# Patient Record
Sex: Male | Born: 1960 | Race: White | Hispanic: No | Marital: Married | Smoking: Never smoker
Health system: Southern US, Community
[De-identification: ages and names within clinical notes are randomized; demographics above are authoritative.]

## PROBLEM LIST (undated history)

## (undated) DIAGNOSIS — F32A Depression, unspecified: Secondary | ICD-10-CM

## (undated) DIAGNOSIS — K219 Gastro-esophageal reflux disease without esophagitis: Secondary | ICD-10-CM

## (undated) DIAGNOSIS — F419 Anxiety disorder, unspecified: Secondary | ICD-10-CM

## (undated) DIAGNOSIS — F329 Major depressive disorder, single episode, unspecified: Secondary | ICD-10-CM

## (undated) HISTORY — PX: SHOULDER OPEN ROTATOR CUFF REPAIR: SHX2407

---

## 2004-03-16 ENCOUNTER — Observation Stay (HOSPITAL_COMMUNITY): Admission: EM | Admit: 2004-03-16 | Discharge: 2004-03-17 | Payer: Self-pay | Admitting: Emergency Medicine

## 2009-01-06 ENCOUNTER — Emergency Department (HOSPITAL_BASED_OUTPATIENT_CLINIC_OR_DEPARTMENT_OTHER): Admission: EM | Admit: 2009-01-06 | Discharge: 2009-01-06 | Payer: Self-pay | Admitting: Emergency Medicine

## 2009-01-17 ENCOUNTER — Encounter: Admission: RE | Admit: 2009-01-17 | Discharge: 2009-02-21 | Payer: Self-pay | Admitting: Orthopedic Surgery

## 2010-08-23 LAB — URINALYSIS, ROUTINE W REFLEX MICROSCOPIC
Bilirubin Urine: NEGATIVE
Glucose, UA: NEGATIVE mg/dL
Hgb urine dipstick: NEGATIVE
Ketones, ur: NEGATIVE mg/dL
Nitrite: NEGATIVE
Protein, ur: NEGATIVE mg/dL
Specific Gravity, Urine: 1.028 (ref 1.005–1.030)
Urobilinogen, UA: 1 mg/dL (ref 0.0–1.0)
pH: 6 (ref 5.0–8.0)

## 2010-08-23 LAB — URINE MICROSCOPIC-ADD ON

## 2010-10-03 NOTE — H&P (Signed)
NAME:  Russell Cross, Russell Cross NO.:  [id number]   MEDICAL RECORD NO.:  [id number]          PATIENT TYPE:  EMS   LOCATION:  [facility]                         FACILITY:  [facility]   PHYSICIAN:  Creta Cross, M.D. LHCDATE OF BIRTH:  April 27, 1961   DATE OF ADMISSION:  03/16/2004  DATE OF DISCHARGE:                                HISTORY & PHYSICAL   PRIMARY CARE PHYSICIAN:  Keturah Cross, M.D.   CHIEF COMPLAINT:  Chest pain.   HISTORY:  Russell Cross is a 50 year old male with no significant medical  history who was in his usual state of health until this morning when, at  work, he noticed some mild chest tightness that was unrelated to exertion or  position.  At approximately noon, he had a moderate to severe episode of  left-sided chest tightness that was associated with palpitations, dizziness,  presyncope, and impending doom.  These symptoms resolved spontaneously, but  did recur for one episode that was of lesser intensity and shorter duration.  Again, no relation to exertion was noted.  He denies any exertional symptoms  whatsoever.  He has had no orthopnea, PND, or edema.  He has no other  complaints on complete review of systems.   PAST MEDICAL HISTORY:  1.  Positive for renal stones.  2.  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   ALLERGIES:  No known drug allergies.   MEDICATIONS:  1.  Ibuprofen p.r.n.  2.  Triaflex, which I believe is a glucosamine and chondroitin product.   SOCIAL HISTORY:  [place] and 3  children.  He works as a Chartered certified accountant at [employer].  He does not smoke.  He drinks  less than 1 drink per day.  He denies herbal medications, other than the  Triaflex.  He denies illicit drugs.  He does lift weights 3 times a week.   FAMILY HISTORY:  His mother is alive at age 32 and healthy.  Father is alive  at age 20 and has some psychiatric disorder that is described as anxiety or  phobia.   REVIEW OF SYSTEMS:  Positive only for that mentioned in the HPI.  He does  admit to some anxiety related to stress at work.  Otherwise, the 10-point  review is completely negative.   PHYSICAL EXAMINATION:  VITAL SIGNS:  He is afebrile.  Blood pressure is  122/51, heart rate 81.  He is saturating 99% on 2 liters.  GENERAL:  He is a well-developed male in no acute distress.  HEENT:  Unremarkable.  Good dentition.  NECK:  Supple.  No lymphadenopathy.  No thyromegaly.  No JVD.  No bruits.  Normal carotid upstroke.  CARDIOVASCULAR:  Regular rate and rhythm with a normal S1, normal S2.  No  murmur, rub, or gallop.  LUNGS:  Clear.  CHEST:  The chest wall is nontender.  ABDOMEN:  Soft, nondistended, nontender, without rebound or guarding.  RECTAL:  Deferred.  EXTREMITIES:  No cyanosis, clubbing, or edema.  MUSCULOSKELETAL:  Again, he had no chest wall tenderness.  NEUROLOGIC:  Completely nonfocal.  Chest x-ray is pending.  EKG revealed normal sinus rhythm at a rate of 69.  He does have a rightward axis and some nonspecific ST-T wave changes, but no  evidence of injury or ischemia.   LABORATORY DATA:  The hematocrit is 41, sodium 140, potassium 3.5, chloride  105, bicarbonate 26, BUN 16, creatinine 1.1, glucose 108.  He had a troponin  of less than 0.05, and an MB of 1.1.  The remainder of his labs are pending.   ASSESSMENT AND PLAN:  Atypical chest pain.  This patient is very unlikely to  have an acute coronary syndrome; however, it is reasonable to bring him in  for observation and rule him out for myocardial infarction.  Given the low  suspicion, I think that if he rules out, he can be released with follow up  and possibly a functional study.  While he is in house, I will check his  lipids and his TSH.  We will communicate all findings with Russell Cross to  complete the work-up, which might need to focus on non-cardiac chest pain  and possible anxiety.       RPK/MEDQ  D:   03/16/2004  T:  03/16/2004  Job:  [id number]   cc:   Keturah Cross, M.D.  [place](?)

## 2010-12-27 ENCOUNTER — Other Ambulatory Visit: Payer: Self-pay

## 2010-12-27 ENCOUNTER — Emergency Department (HOSPITAL_BASED_OUTPATIENT_CLINIC_OR_DEPARTMENT_OTHER)
Admission: EM | Admit: 2010-12-27 | Discharge: 2010-12-27 | Disposition: A | Discharge status: Home / Self Care | Payer: BC Managed Care – PPO | Attending: Emergency Medicine | Admitting: Emergency Medicine

## 2010-12-27 ENCOUNTER — Emergency Department (INDEPENDENT_AMBULATORY_CARE_PROVIDER_SITE_OTHER): Payer: BC Managed Care – PPO

## 2010-12-27 ENCOUNTER — Encounter: Payer: Self-pay | Admitting: Emergency Medicine

## 2010-12-27 DIAGNOSIS — R1031 Right lower quadrant pain: Secondary | ICD-10-CM

## 2010-12-27 DIAGNOSIS — F3289 Other specified depressive episodes: Secondary | ICD-10-CM | POA: Insufficient documentation

## 2010-12-27 DIAGNOSIS — F329 Major depressive disorder, single episode, unspecified: Secondary | ICD-10-CM | POA: Insufficient documentation

## 2010-12-27 DIAGNOSIS — R1084 Generalized abdominal pain: Secondary | ICD-10-CM

## 2010-12-27 DIAGNOSIS — R12 Heartburn: Secondary | ICD-10-CM | POA: Insufficient documentation

## 2010-12-27 DIAGNOSIS — R110 Nausea: Secondary | ICD-10-CM

## 2010-12-27 HISTORY — DX: Depression, unspecified: F32.A

## 2010-12-27 HISTORY — DX: Major depressive disorder, single episode, unspecified: F32.9

## 2010-12-27 LAB — URINALYSIS, ROUTINE W REFLEX MICROSCOPIC
Bilirubin Urine: NEGATIVE
Glucose, UA: NEGATIVE mg/dL
Hgb urine dipstick: NEGATIVE
Ketones, ur: NEGATIVE mg/dL
Leukocytes, UA: NEGATIVE
Nitrite: NEGATIVE
Protein, ur: NEGATIVE mg/dL
Specific Gravity, Urine: 1.026 (ref 1.005–1.030)
Urobilinogen, UA: 0.2 mg/dL (ref 0.0–1.0)
pH: 5.5 (ref 5.0–8.0)

## 2010-12-27 LAB — CBC
HCT: 43.4 % (ref 39.0–52.0)
Hemoglobin: 15.5 g/dL (ref 13.0–17.0)
MCH: 32.8 pg (ref 26.0–34.0)
MCHC: 35.7 g/dL (ref 30.0–36.0)
MCV: 91.8 fL (ref 78.0–100.0)
Platelets: 251 10*3/uL (ref 150–400)
RBC: 4.73 MIL/uL (ref 4.22–5.81)
RDW: 12.2 % (ref 11.5–15.5)
WBC: 5.5 10*3/uL (ref 4.0–10.5)

## 2010-12-27 LAB — COMPREHENSIVE METABOLIC PANEL
ALT: 36 U/L (ref 0–53)
AST: 31 U/L (ref 0–37)
Albumin: 3.8 g/dL (ref 3.5–5.2)
Alkaline Phosphatase: 80 U/L (ref 39–117)
BUN: 16 mg/dL (ref 6–23)
CO2: 25 mEq/L (ref 19–32)
Calcium: 9.6 mg/dL (ref 8.4–10.5)
Chloride: 102 mEq/L (ref 96–112)
Creatinine, Ser: 1.4 mg/dL — ABNORMAL HIGH (ref 0.50–1.35)
GFR calc Af Amer: >60 mL/min (ref >60)
GFR calc non Af Amer: 54 mL/min — ABNORMAL LOW (ref >60)
Glucose, Bld: 101 mg/dL — ABNORMAL HIGH (ref 70–99)
Potassium: 3.7 mEq/L (ref 3.5–5.1)
Sodium: 137 mEq/L (ref 135–145)
Total Bilirubin: 0.3 mg/dL (ref 0.3–1.2)
Total Protein: 7.2 g/dL (ref 6.0–8.3)

## 2010-12-27 LAB — LIPASE, BLOOD: Lipase: 36 U/L (ref 11–59)

## 2010-12-27 MED ORDER — IOHEXOL 300 MG/ML  SOLN
100.0000 mL | Freq: Once | INTRAMUSCULAR | Status: AC | PRN
  Administered 2010-12-27: 100 mL via INTRAVENOUS

## 2010-12-27 MED ORDER — HYDROMORPHONE HCL 1 MG/ML IJ SOLN
1.0000 mg | Freq: Once | INTRAMUSCULAR | Status: AC
  Administered 2010-12-27: 1 mg via INTRAVENOUS
  Filled 2010-12-27: qty 1

## 2010-12-27 MED ORDER — ONDANSETRON HCL 4 MG/2ML IJ SOLN
4.0000 mg | Freq: Once | INTRAMUSCULAR | Status: AC
Start: 2010-12-27 — End: 2010-12-27
  Administered 2010-12-27: 4 mg via INTRAVENOUS
  Filled 2010-12-27: qty 2

## 2010-12-27 MED ORDER — PANTOPRAZOLE SODIUM 20 MG PO TBEC: 20.0000 mg | DELAYED_RELEASE_TABLET | Freq: Every day | ORAL | Status: AC

## 2010-12-27 MED ORDER — SODIUM CHLORIDE 0.9 % IV BOLUS (SEPSIS)
1000.0000 mL | Freq: Once | INTRAVENOUS | Status: AC
  Administered 2010-12-27: 1000 mL via INTRAVENOUS

## 2010-12-27 NOTE — ED Notes (Signed)
Patient states that he felt light headed this morning and lost consciousness after experiencing some abdominal pain in the mid epigastric area. Pt. States that he has traveled recently and after returning from his trip from the beach yesterday experienced some back pain. Pt. Is experiencing pain in his epigastric region, left side and back this morning with back pain and dizziness. Patient states that he had diarrhea this morning x 2. Hypoactive bowel sound present.

## 2010-12-27 NOTE — ED Notes (Signed)
Pt brought to room,  EKG performed.  Pt placed on cardiac monitor, pulse oximetry, and oxygen at 2L per [place].  IV established, blood drawn for labs  

## 2010-12-27 NOTE — ED Provider Notes (Signed)
History     CSN: [id number] Arrival date & time: 12/27/2010 12:21 PM  Chief Complaint  Patient presents with  . Abdominal Pain   Patient is a 50 y.o. male presenting with abdominal pain.  Abdominal Pain The primary symptoms of the illness include abdominal pain. The current episode started 13 to 24 hours ago. The onset of the illness was gradual. The problem has been gradually worsening.  Associated with: nothing. The patient has not had a change in bowel habit. Additional symptoms associated with the illness include heartburn. Symptoms associated with the illness do not include chills, diaphoresis, constipation, urgency, frequency or back pain. Significant associated medical issues include GERD. Significant associated medical issues do not include inflammatory bowel disease, diabetes, gallstones, diverticulitis or cardiac disease.   patient reports he has a hernia at umbilicus. Patient complains of pain in the suprapubic area. He also reports that he has had some indigestion and belching  Past Medical History  Diagnosis Date  . Depression     Past Surgical History  Procedure Date  . Shoulder open rotator cuff repair     History reviewed. No pertinent family history.  History  Substance Use Topics  . Smoking status: Never Smoker   . Smokeless tobacco: Not on file  . Alcohol Use: Yes     occasionally      Review of Systems  Constitutional: Negative for chills and diaphoresis.  Gastrointestinal: Positive for heartburn and abdominal pain. Negative for constipation.  Genitourinary: Negative for urgency and frequency.  Musculoskeletal: Negative for back pain.  All other systems reviewed and are negative.    Physical Exam  BP 152/91  Pulse 75  Temp(Src) 97.9 F (36.6 C) (Oral)  Resp 18  Ht 5\' 9"  (1.753 m)  Wt 202 lb (91.627 kg)  BMI 29.83 kg/m2  SpO2 99%  Physical Exam  Constitutional: He is oriented to person, place, and time. He appears well-developed and  well-nourished.  HENT:  Head: Normocephalic and atraumatic.  Eyes: Conjunctivae and EOM are normal. Pupils are equal, round, and reactive to light.  Neck: Normal range of motion. Neck supple.  Cardiovascular: Normal rate and regular rhythm.   Pulmonary/Chest: Effort normal and breath sounds normal.  Abdominal: Soft. There is tenderness.  Musculoskeletal: Normal range of motion.  Neurological: He is alert and oriented to person, place, and time. He has normal reflexes.  Skin: Skin is warm and dry.  Psychiatric: He has a normal mood and affect.    ED Course  Procedures  MDM CT is normal. Creatinine is slightly elevated at 1.40 patient is given IV normal saline x1 L. Other laboratory evaluations are normal I advised patient he should see his physician for recheck on Monday. He is to return to the emergency department if symptoms worsen or change  Results for orders placed during the hospital encounter of 12/27/10  URINALYSIS, ROUTINE W REFLEX MICROSCOPIC      Component Value Range   Color, Urine YELLOW  YELLOW    Appearance CLEAR  CLEAR    Specific Gravity, Urine 1.026  1.005 - 1.030    pH 5.5  5.0 - 8.0    Glucose, UA NEGATIVE  NEGATIVE (mg/dL)   Hgb urine dipstick NEGATIVE  NEGATIVE    Bilirubin Urine NEGATIVE  NEGATIVE    Ketones, ur NEGATIVE  NEGATIVE (mg/dL)   Protein, ur NEGATIVE  NEGATIVE (mg/dL)   Urobilinogen, UA 0.2  0.0 - 1.0 (mg/dL)   Nitrite NEGATIVE  NEGATIVE    Leukocytes,  UA NEGATIVE  NEGATIVE   CBC      Component Value Range   WBC 5.5  4.0 - 10.5 (K/uL)   RBC 4.73  4.22 - 5.81 (MIL/uL)   Hemoglobin 15.5  13.0 - 17.0 (g/dL)   HCT 95.6  21.3 - 08.6 (%)   MCV 91.8  78.0 - 100.0 (fL)   MCH 32.8  26.0 - 34.0 (pg)   MCHC 35.7  30.0 - 36.0 (g/dL)   RDW 57.8  46.9 - 62.9 (%)   Platelets 251  150 - 400 (K/uL)  COMPREHENSIVE METABOLIC PANEL      Component Value Range   Sodium 137  135 - 145 (mEq/L)   Potassium 3.7  3.5 - 5.1 (mEq/L)   Chloride 102  96 - 112  (mEq/L)   CO2 25  19 - 32 (mEq/L)   Glucose, Bld 101 (*) 70 - 99 (mg/dL)   BUN 16  6 - 23 (mg/dL)   Creatinine, Ser 5.28 (*) 0.50 - 1.35 (mg/dL)   Calcium 9.6  8.4 - 41.3 (mg/dL)   Total Protein 7.2  6.0 - 8.3 (g/dL)   Albumin 3.8  3.5 - 5.2 (g/dL)   AST 31  0 - 37 (U/L)   ALT 36  0 - 53 (U/L)   Alkaline Phosphatase 80  39 - 117 (U/L)   Total Bilirubin 0.3  0.3 - 1.2 (mg/dL)   GFR calc non Af Amer 54 (*) >60 (mL/min)   GFR calc Af Amer >60  >60 (mL/min)  LIPASE, BLOOD      Component Value Range   Lipase 36  11 - 59 (U/L)   Ct Abdomen Pelvis W Contrast  12/27/2010  *RADIOLOGY REPORT*  Clinical Data: RLQ abdominal pain, nausea  CT ABDOMEN AND PELVIS WITH CONTRAST  Technique:  Multidetector CT imaging of the abdomen and pelvis was performed following the standard protocol during bolus administration of intravenous contrast.  Contrast: 100 ml Omnipaque-300 IV  Comparison: None.  Findings: Minimal dependent atelectasis in the lung bases.  Liver is notable for a 7 mm hypoenhancing lesion in the posterior several right hepatic lobe (series 2/image 23), incompletely characterized, likely benign.  The spleen, pancreas, adrenal glands are within normal limits.  Gallbladder is unremarkable.  No intrahepatic or extrahepatic ductal dilatation.  Left kidney is notable for a renal cyst.  Right kidney is within normal limits.  No hydronephrosis.  No evidence of bowel obstruction.  Normal appendix.  No evidence of abdominal aortic aneurysm.  No abdominopelvic ascites.  No suspicious abdominopelvic lymphadenopathy.  Prostate is unremarkable.  Bladder is within normal limits.  Mild degenerative changes of the visualized thoracolumbar spine.  IMPRESSION: Normal appendix.  No evidence of bowel obstruction.  No CT findings to account for the patient's abdominal symptoms.  Original Report Authenticated By: Charline Bills, M.D.   Date: 12/27/2010  Rate: 69  Rhythm: normal sinus rhythm  QRS Axis: normal  Intervals:  normal  ST/T Wave abnormalities: normal  Conduction Disutrbances:none  Narrative Interpretation:   Old EKG Reviewed: unchanged     Langston Masker, [place] 12/27/10 1603

## 2010-12-28 ENCOUNTER — Encounter (HOSPITAL_BASED_OUTPATIENT_CLINIC_OR_DEPARTMENT_OTHER): Payer: Self-pay | Admitting: Emergency Medicine

## 2010-12-29 NOTE — ED Provider Notes (Signed)
Evaluation and management procedures were performed by the PA/NP under my supervision/collaboration.    Felisa Bonier, MD 12/29/10 [phone]

## 2012-07-03 ENCOUNTER — Emergency Department (HOSPITAL_COMMUNITY)
Admission: EM | Admit: 2012-07-03 | Discharge: 2012-07-03 | Disposition: A | Discharge status: Home / Self Care | Payer: BC Managed Care – PPO | Attending: Emergency Medicine | Admitting: Emergency Medicine

## 2012-07-03 ENCOUNTER — Encounter (HOSPITAL_COMMUNITY): Payer: Self-pay | Admitting: *Deleted

## 2012-07-03 DIAGNOSIS — R42 Dizziness and giddiness: Secondary | ICD-10-CM | POA: Insufficient documentation

## 2012-07-03 DIAGNOSIS — R079 Chest pain, unspecified: Secondary | ICD-10-CM

## 2012-07-03 DIAGNOSIS — Z79899 Other long term (current) drug therapy: Secondary | ICD-10-CM | POA: Insufficient documentation

## 2012-07-03 DIAGNOSIS — F3289 Other specified depressive episodes: Secondary | ICD-10-CM | POA: Insufficient documentation

## 2012-07-03 DIAGNOSIS — K219 Gastro-esophageal reflux disease without esophagitis: Secondary | ICD-10-CM

## 2012-07-03 DIAGNOSIS — F329 Major depressive disorder, single episode, unspecified: Secondary | ICD-10-CM | POA: Insufficient documentation

## 2012-07-03 DIAGNOSIS — F411 Generalized anxiety disorder: Secondary | ICD-10-CM | POA: Insufficient documentation

## 2012-07-03 DIAGNOSIS — R0602 Shortness of breath: Secondary | ICD-10-CM | POA: Insufficient documentation

## 2012-07-03 HISTORY — DX: Gastro-esophageal reflux disease without esophagitis: K21.9

## 2012-07-03 HISTORY — DX: Anxiety disorder, unspecified: F41.9

## 2012-07-03 LAB — CBC WITH DIFFERENTIAL/PLATELET
Basophils Absolute: 0.1 10*3/uL (ref 0.0–0.1)
Basophils Relative: 2 % — ABNORMAL HIGH (ref 0–1)
Eosinophils Absolute: 0.2 10*3/uL (ref 0.0–0.7)
Eosinophils Relative: 3 % (ref 0–5)
HCT: 44.4 % (ref 39.0–52.0)
Hemoglobin: 16 g/dL (ref 13.0–17.0)
Lymphocytes Relative: 35 % (ref 12–46)
Lymphs Abs: 1.9 10*3/uL (ref 0.7–4.0)
MCH: 33.2 pg (ref 26.0–34.0)
MCHC: 36 g/dL (ref 30.0–36.0)
MCV: 92.1 fL (ref 78.0–100.0)
Monocytes Absolute: 0.7 10*3/uL (ref 0.1–1.0)
Monocytes Relative: 13 % — ABNORMAL HIGH (ref 3–12)
Neutro Abs: 2.5 10*3/uL (ref 1.7–7.7)
Neutrophils Relative %: 47 % (ref 43–77)
Platelets: 245 10*3/uL (ref 150–400)
RBC: 4.82 MIL/uL (ref 4.22–5.81)
RDW: 11.9 % (ref 11.5–15.5)
WBC: 5.4 10*3/uL (ref 4.0–10.5)

## 2012-07-03 LAB — TROPONIN I: Troponin I: <0.3 ng/mL (ref <0.30)

## 2012-07-03 LAB — URINALYSIS, ROUTINE W REFLEX MICROSCOPIC
Bilirubin Urine: NEGATIVE
Glucose, UA: NEGATIVE mg/dL
Hgb urine dipstick: NEGATIVE
Ketones, ur: NEGATIVE mg/dL
Leukocytes, UA: NEGATIVE
Nitrite: NEGATIVE
Protein, ur: NEGATIVE mg/dL
Specific Gravity, Urine: 1.014 (ref 1.005–1.030)
Urobilinogen, UA: 1 mg/dL (ref 0.0–1.0)
pH: 7 (ref 5.0–8.0)

## 2012-07-03 MED ORDER — ALUM & MAG HYDROXIDE-SIMETH 200-200-20 MG/5ML PO SUSP: 30.0000 mL | Freq: Once | ORAL | Status: DC

## 2012-07-03 MED ORDER — GI COCKTAIL ~~LOC~~
30.0000 mL | Freq: Once | ORAL | Status: AC
  Administered 2012-07-03: 30 mL via ORAL
  Filled 2012-07-03: qty 30

## 2012-07-03 NOTE — ED Notes (Signed)
Pt arrived by [facility], had onset of mid and left side chest tightness around 12, it resolved and then returned again while eating lunch. Pt was diaphoretic at onset and had mild numbness to left hand. Pain resolved prior to ems arrival, only reports mild left hand numbness at this time. Reports similar episode occurred 5 yrs ago and was told it was anxiety related.

## 2012-07-03 NOTE — ED Provider Notes (Signed)
History     CSN: [id number]  Arrival date & time 07/03/12  1439   First MD Initiated Contact with Patient 07/03/12 1457      Chief Complaint  Patient presents with  . Chest Pain    (Consider location/radiation/quality/duration/timing/severity/associated sxs/prior treatment) HPI Comments: Patient reports that he was in his usual state of health and was at his job where he is usually up standing and walking around most of the day. He was not doing anything particularly very exertional and he had an episode of feeling dizzy, felt clammy especially in his left hand versus his right but did not actually sweat and had 10 of a tight and pounding sensation right over where his heart would be on the left chest area. It was not radiating. He was minimally short of breath. This lasted for several minutes before resolving. This happened 2 or 3 times more and after being evaluated by the health care professional at his job, decision was made to be evaluated in the emergency department. He reports that he did have an anxiety or panic attack about 4 years ago and did have a exercise stress test at that time which was negative. At that point he was put on Wellbutrin and Celexa. He also reports that he a history of reflux for which he used to take times daily but since then has been put on Protonix by his primary care physician and he reports that he had not had any further episodes since then. He denies fever, cough or cold symptoms. He reports overall his symptoms seemed to resolve but still has very slight tight sensation around the left chest area. He reports he does not smoke, drinks occasionally. He does not have a history of hypertension, hyperlipidemia or diabetes. He does take a fish oil daily. He reports his father died of a heart attack at the age of 47 but he was a heavy smoker. Patient reports he does do some physical activity around the house and reports over the last several weeks he has not had any  chest pain, tightness or difficulty breathing with exertional activities.  Patient is a 52 y.o. male presenting with chest pain. The history is provided by the patient.  Chest Pain Associated symptoms: dizziness and shortness of breath   Associated symptoms: no abdominal pain, no cough, no diaphoresis, no fever, no headache, no nausea and not vomiting     Past Medical History  Diagnosis Date  . Depression   . Anxiety   . GERD (gastroesophageal reflux disease)     Past Surgical History  Procedure Laterality Date  . Shoulder open rotator cuff repair      Family History  Problem Relation Age of Onset  . Heart attack Father     History  Substance Use Topics  . Smoking status: Never Smoker   . Smokeless tobacco: Not on file  . Alcohol Use: Yes     Comment: occasionally      Review of Systems  Constitutional: Negative for fever, chills and diaphoresis.  HENT: Negative for congestion and sinus pressure.   Respiratory: Positive for shortness of breath. Negative for cough.   Cardiovascular: Positive for chest pain.  Gastrointestinal: Negative for nausea, vomiting and abdominal pain.  Neurological: Positive for dizziness. Negative for headaches.  All other systems reviewed and are negative.    Allergies  Review of patient's allergies indicates no known allergies.  Home Medications   Current Outpatient Rx  Name  Route  Sig  Dispense  Refill  . buPROPion (WELLBUTRIN XL) 150 MG 24 hr tablet   Oral   Take 150 mg by mouth 2 (two) times daily.          . cephALEXin (KEFLEX) 500 MG capsule   Oral   Take 500 mg by mouth 3 (three) times daily. Take for 10 days         . citalopram (CELEXA) 10 MG tablet   Oral   Take 10 mg by mouth daily.         . pantoprazole (PROTONIX) 20 MG tablet   Oral   Take 1 tablet (20 mg total) by mouth daily.   14 tablet   0     BP 141/76  Temp(Src) 97.9 F (36.6 C) (Oral)  Resp 16  SpO2 96%  Physical Exam  Nursing note and  vitals reviewed. Constitutional: He is oriented to person, place, and time. He appears well-developed and well-nourished.  HENT:  Head: Normocephalic and atraumatic.  Eyes: No scleral icterus.  Neck: Normal range of motion. Neck supple.  Cardiovascular: Normal rate and regular rhythm.   Pulmonary/Chest: Effort normal and breath sounds normal. No respiratory distress. He has no wheezes. He has no rales.  Abdominal: Soft. He exhibits no distension. There is no tenderness.  Neurological: He is alert and oriented to person, place, and time. Coordination normal.  Skin: Skin is warm and dry. No rash noted.  Psychiatric: He has a normal mood and affect.    ED Course  Procedures (including critical care time)  Labs Reviewed  CBC WITH DIFFERENTIAL - Abnormal; Notable for the following:    Monocytes Relative 13 (*)    Basophils Relative 2 (*)    All other components within normal limits  URINALYSIS, ROUTINE W REFLEX MICROSCOPIC  TROPONIN I   No results found.   1. Chest pain   2. GERD (gastroesophageal reflux disease)     EKG at time 14:45 shows sinus rhythm at a rate of 58. Normal axis. Very subtle nonspecific T-wave abnormality seen inferior leads. Her very similar to ECG from 12/27/2010. Normal intervals. Interpretation is that of a borderline ECG very similar appearance to prior.    4:53 PM Troponin is <0.3, pt remains symptom free.  No further events on cardiac monitor.  Pt reports GI cocktail improved chest tightness/pain.  Pt again reassured, encouraged follow up with PMD.       MDM   Patient with atypical chest pain symptoms including very focal area of tightness and palpitations associated with dizziness and subjective feeling flushed. Symptoms recurred several times but were relatively brief in nature and did not occur with exertional activity. Patient has very few overt risk factors and has a TIMI score of 0. He does have a family history of cardiac disease however the  patient himself does not smoke nor have typical medical risk factors. Patient is much improved at this time but do to his history of both anxiety and reflux, alternative diagnoses to exist. Plan is to give him a GI cocktail here to see if his symptoms improve. In the meantime will obtain basic labs including a troponin and will continue to monitor him and his department.        Gavin Pound. Signora Zucco, MD 07/03/12 2130

## 2012-07-03 NOTE — Discharge Instructions (Signed)
Chest Pain (Nonspecific) It is often hard to give a specific diagnosis for the cause of chest pain. There is always a chance that your pain could be related to something serious, such as a heart attack or a blood clot in the lungs. You need to follow up with your caregiver for further evaluation. CAUSES   Heartburn.  Pneumonia or bronchitis.  Anxiety or stress.  Inflammation around your heart (pericarditis) or lung (pleuritis or pleurisy).  A blood clot in the lung.  A collapsed lung (pneumothorax). It can develop suddenly on its own (spontaneous pneumothorax) or from injury (trauma) to the chest.  Shingles infection (herpes zoster virus). The chest wall is composed of bones, muscles, and cartilage. Any of these can be the source of the pain.  The bones can be bruised by injury.  The muscles or cartilage can be strained by coughing or overwork.  The cartilage can be affected by inflammation and become sore (costochondritis). DIAGNOSIS  Lab tests or other studies, such as X-rays, electrocardiography, stress testing, or cardiac imaging, may be needed to find the cause of your pain.  TREATMENT   Treatment depends on what may be causing your chest pain. Treatment may include:  Acid blockers for heartburn.  Anti-inflammatory medicine.  Pain medicine for inflammatory conditions.  Antibiotics if an infection is present.  You may be advised to change lifestyle habits. This includes stopping smoking and avoiding alcohol, caffeine, and chocolate.  You may be advised to keep your head raised (elevated) when sleeping. This reduces the chance of acid going backward from your stomach into your esophagus.  Most of the time, nonspecific chest pain will improve within 2 to 3 days with rest and mild pain medicine. HOME CARE INSTRUCTIONS   If antibiotics were prescribed, take your antibiotics as directed. Finish them even if you start to feel better.  For the next few days, avoid physical  activities that bring on chest pain. Continue physical activities as directed.  Do not smoke.  Avoid drinking alcohol.  Only take over-the-counter or prescription medicine for pain, discomfort, or fever as directed by your caregiver.  Follow your caregiver's suggestions for further testing if your chest pain does not go away.  Keep any follow-up appointments you made. If you do not go to an appointment, you could develop lasting (chronic) problems with pain. If there is any problem keeping an appointment, you must call to reschedule. SEEK MEDICAL CARE IF:   You think you are having problems from the medicine you are taking. Read your medicine instructions carefully.  Your chest pain does not go away, even after treatment.  You develop a rash with blisters on your chest. SEEK IMMEDIATE MEDICAL CARE IF:   You have increased chest pain or pain that spreads to your arm, neck, jaw, back, or abdomen.  You develop shortness of breath, an increasing cough, or you are coughing up blood.  You have severe back or abdominal pain, feel nauseous, or vomit.  You develop severe weakness, fainting, or chills.  You have a fever. THIS IS AN EMERGENCY. Do not wait to see if the pain will go away. Get medical help at once. Call your local emergency services (911 in [place] Do not drive yourself to the hospital. MAKE SURE YOU:   Understand these instructions.  Will watch your condition.  Will get help right away if you are not doing well or get worse. Document Released: 02/11/2005 Document Revised: 07/27/2011 Document Reviewed: 12/08/2007 Exit[employer] Patient Information 2013 [employer].  

## 2013-02-06 IMAGING — CT CT ABD-PELV W/ CM
2 of 5 series · 16 of 46 positions shown, 18 images · IV contrast (APPLIED)
Comparison: None.

CLINICAL DATA: RLQ abdominal pain, nausea

CT ABDOMEN AND PELVIS WITH CONTRAST
TECHNIQUE: Multidetector CT imaging of the abdomen and pelvis was
performed following the standard protocol during bolus
administration of intravenous contrast.
Contrast: 100 ml Gmnipaque-C33 IV

[Series 2: abd/pelvis 5.0 b31f · axial · 0.71mm/px · z∈[-521,-66]mm · 13 of 101 slices shown, 15 images]
[im 5/101  soft-tissue]
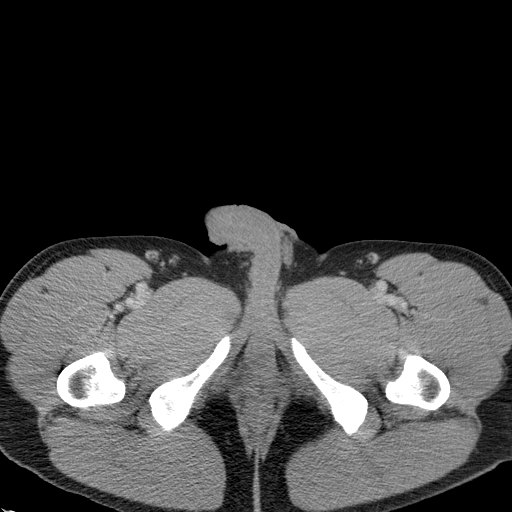
[im 5/101  bone]
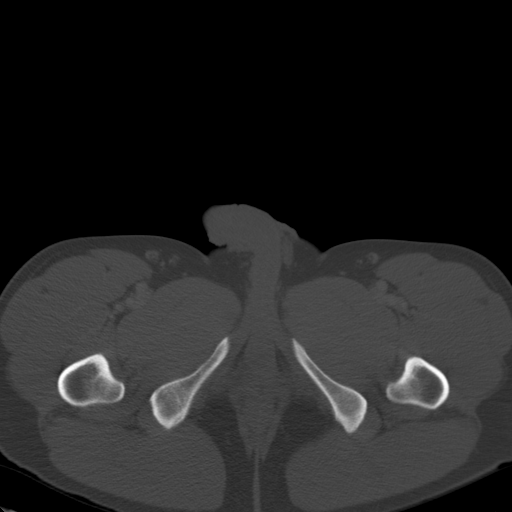
[im 15/101  soft-tissue]
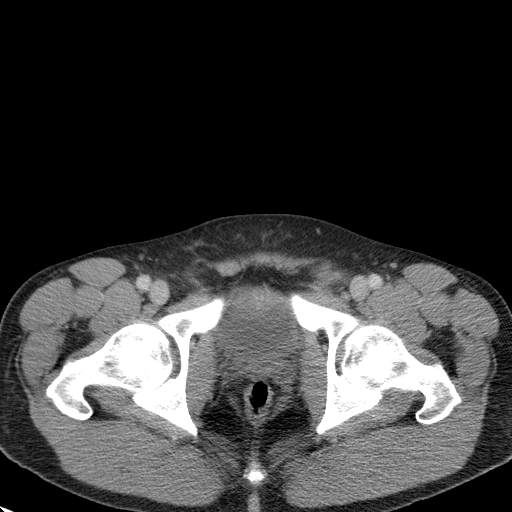
[im 20/101  soft-tissue]
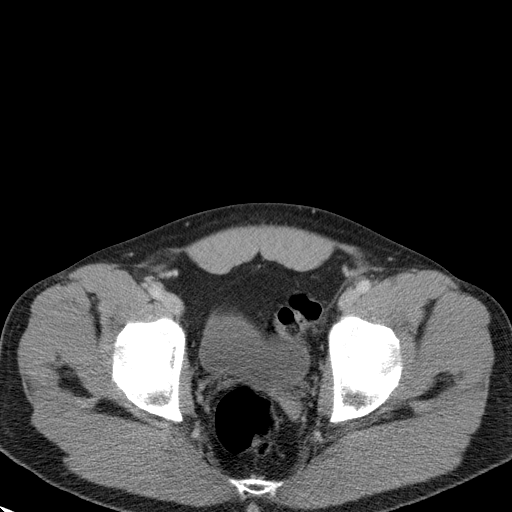
[im 29/101  soft-tissue]
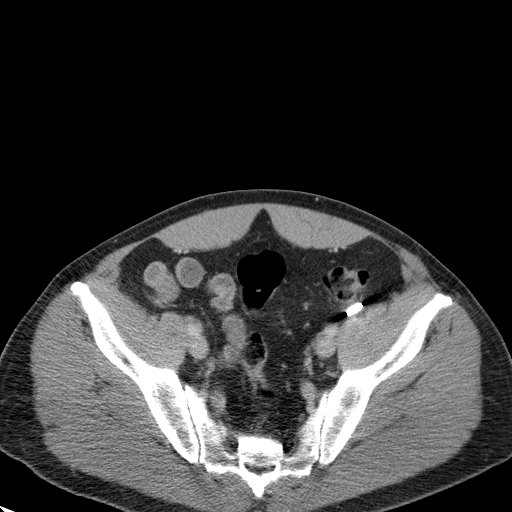
[im 34/101  soft-tissue]
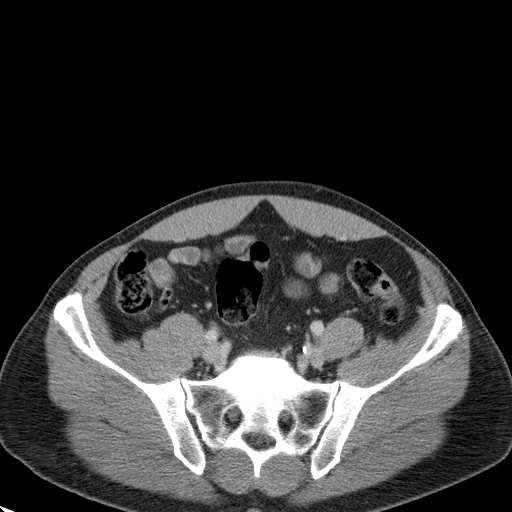
[im 43/101  soft-tissue]
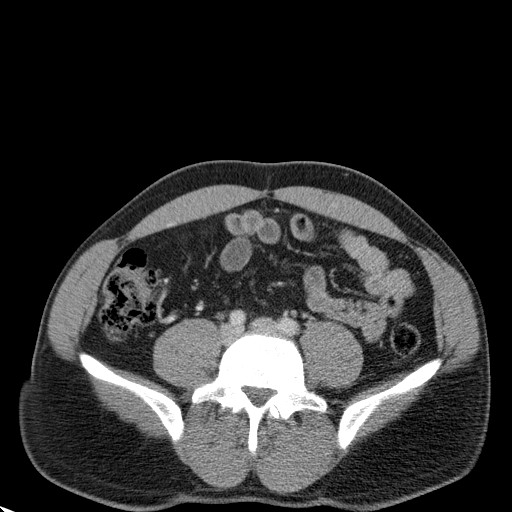
[im 53/101  soft-tissue]
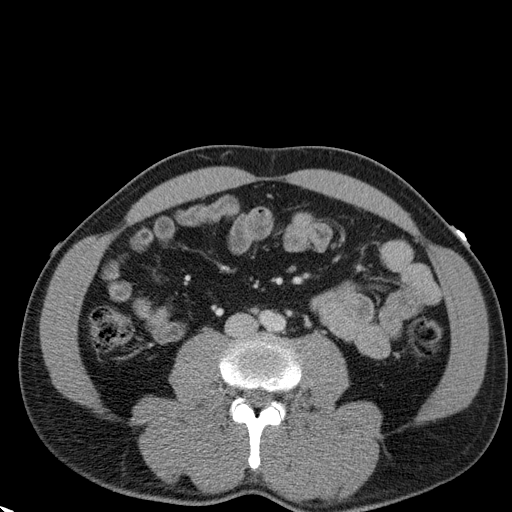
[im 58/101  soft-tissue]
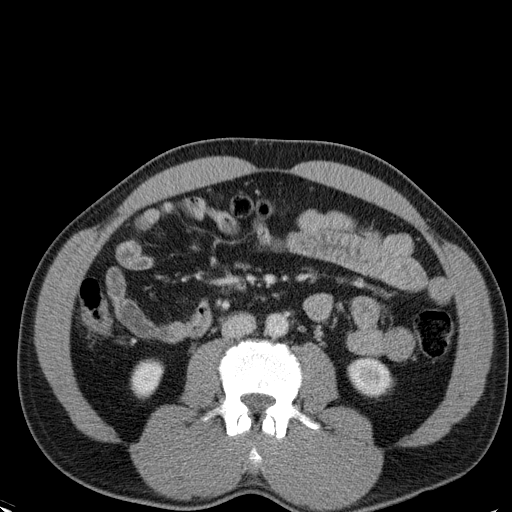
[im 67/101  soft-tissue]
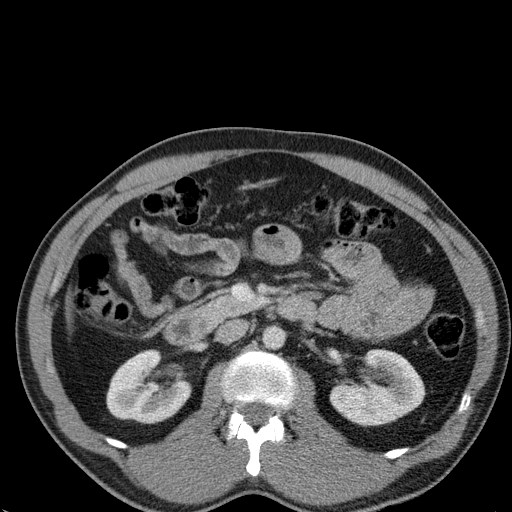
[im 67/101  bone]
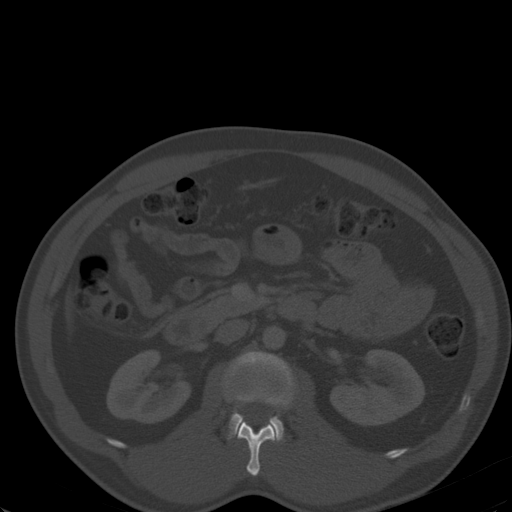
[im 72/101  soft-tissue]
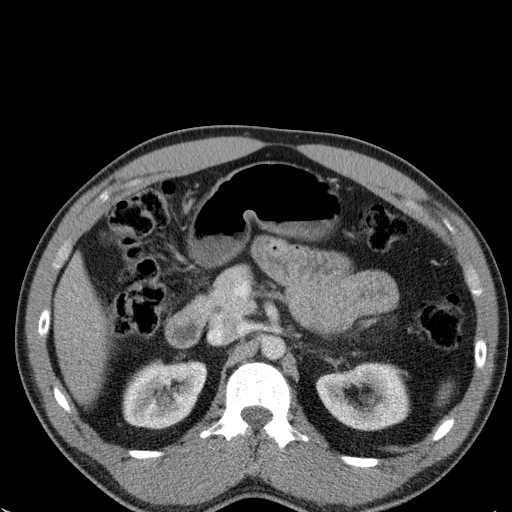
[im 81/101  soft-tissue]
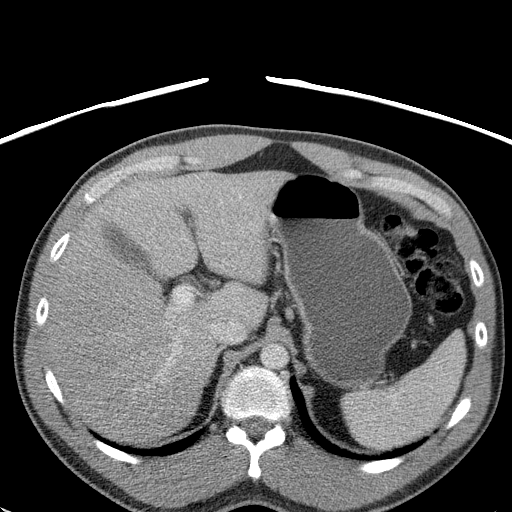
[im 86/101  soft-tissue]
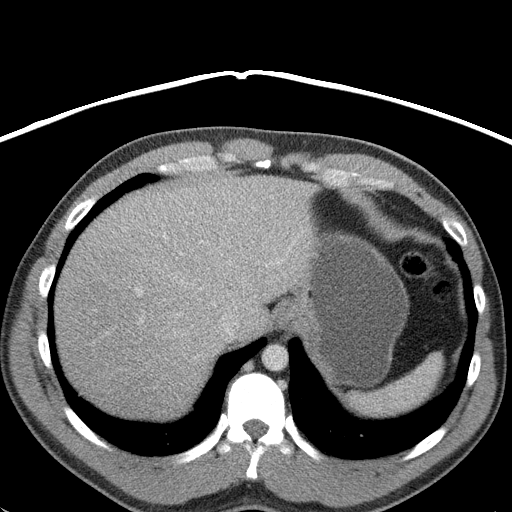
[im 96/101  soft-tissue]
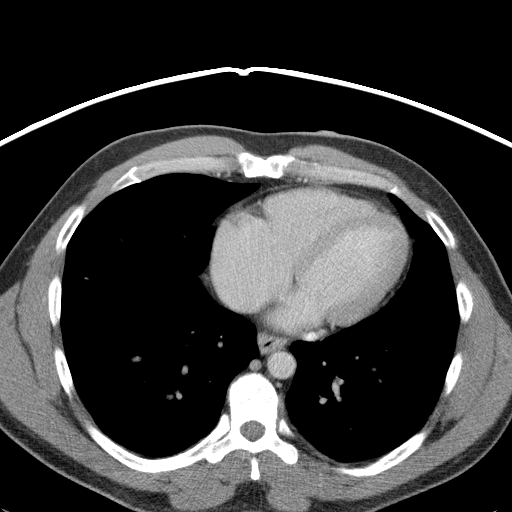

[Series 5: abd/pelvis 3.0 coronal · coronal · 0.91mm/px · 3 of 83 slices shown]
[im 28/83  soft-tissue]
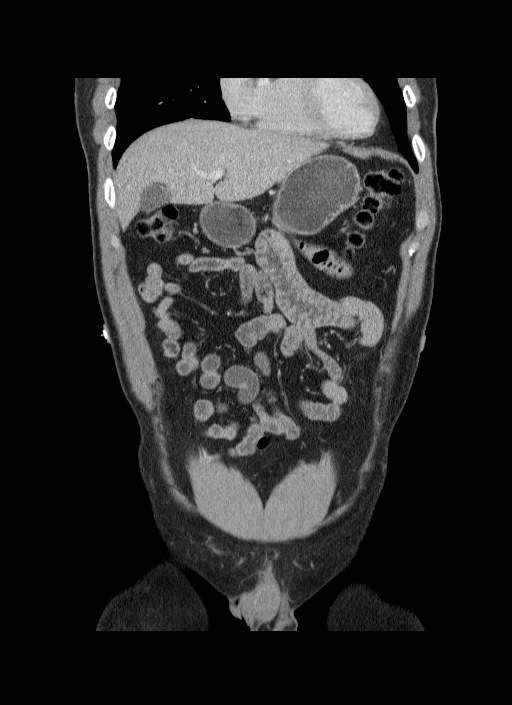
[im 37/83  soft-tissue]
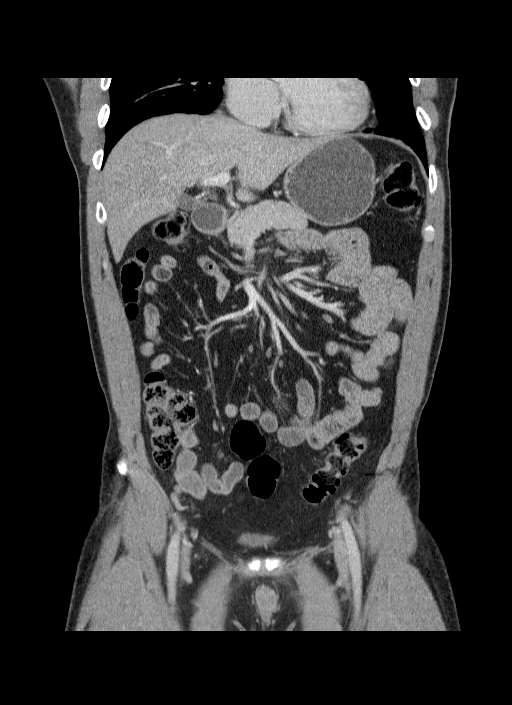
[im 46/83  soft-tissue]
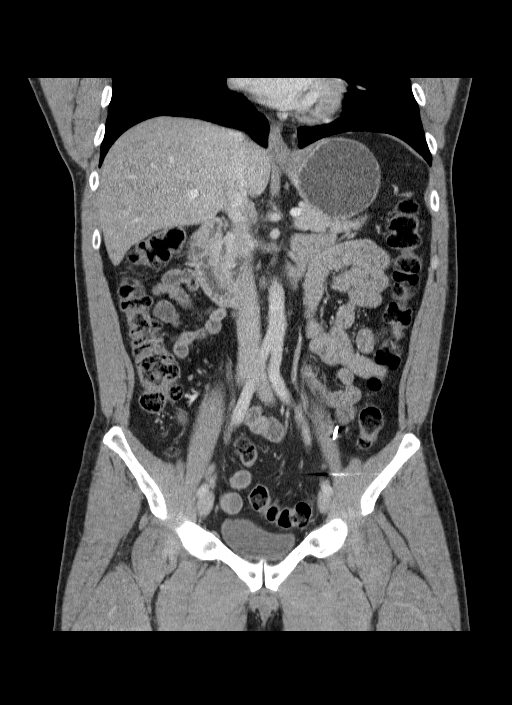

[16 of 46 positions shown; findings below may reference images not displayed]

FINDINGS: Minimal dependent atelectasis in the lung bases.

Liver is notable for a 7 mm hypoenhancing lesion in the posterior
several right hepatic lobe (series 2/image 23), incompletely
characterized, likely benign.

The spleen, pancreas, adrenal glands are within normal limits.

Gallbladder is unremarkable.  No intrahepatic or extrahepatic
ductal dilatation.

Left kidney is notable for a renal cyst.  Right kidney is within
normal limits.  No hydronephrosis.

No evidence of bowel obstruction.  Normal appendix.

No evidence of abdominal aortic aneurysm.

No abdominopelvic ascites.  No suspicious abdominopelvic
lymphadenopathy.

Prostate is unremarkable.

Bladder is within normal limits.

Mild degenerative changes of the visualized thoracolumbar spine.
IMPRESSION: Normal appendix.

No evidence of bowel obstruction.

No CT findings to account for the patient's abdominal symptoms.

## 2022-06-01 ENCOUNTER — Ambulatory Visit (INDEPENDENT_AMBULATORY_CARE_PROVIDER_SITE_OTHER): Payer: Worker's Compensation | Admitting: Orthopedic Surgery

## 2022-06-01 ENCOUNTER — Encounter: Payer: Self-pay | Admitting: Orthopedic Surgery

## 2022-06-01 ENCOUNTER — Telehealth: Payer: Self-pay

## 2022-06-01 DIAGNOSIS — M25511 Pain in right shoulder: Secondary | ICD-10-CM

## 2022-06-01 NOTE — Telephone Encounter (Signed)
Auth needed for shoulder MRI

## 2022-06-01 NOTE — Progress Notes (Signed)
Office Visit Note   Patient: Russell Cross           Date of Birth: 1961-01-25           MRN: [id number] Visit Date: 06/01/2022 Requested by: Myrlene Broker, MD [place],  [place] [address] PCP: Myrlene Broker, MD  Subjective: Chief Complaint  Patient presents with   Right Shoulder - Pain    HPI: Russell Cross is a 62 y.o. male who presents to the office reporting right shoulder pain.  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  This broke and he had a relatively unrestrained muscular pull to the left side of his body from the right arm.  He has been on light duty since that time.  Pain wakes him from sleep at night.  Takes ibuprofen with some relief.  Reports a little numbness and tingling in the hand in the middle and ring finger.  Denies any radicular pain but does report some occasional neck pain.  Did not have any bruising after the injury.  He could not move the arm for an hour after the injury.  When he is using the trigger he has radiating pain to the shoulder..                ROS: All systems reviewed are negative as they relate to the chief complaint within the history of present illness.  Patient denies fevers or chills.  Assessment & Plan: Visit Diagnoses:  1. Right shoulder pain, unspecified chronicity     Plan: Impression is right shoulder pain with mechanism of injury concerning for subscap tear.  Ultrasound evaluation today was not definitive for demonstrating subscap tear but it was suggestive of at least a partial thickness tear.  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  Plan is MRI arthrogram of the right shoulder to evaluate rotator cuff tear and subscap tear.  Follow-up after that study.  Continue with light duty for now.  Has a high likelihood of rotator cuff pathology based on mechanism of injury as well as presence of Popeye deformity on the  right-hand side.  Ultrasound imaging does demonstrate and suggest at least partial if not full-thickness rotator cuff pathology.  Pec tendon is palpable and intact  Follow-Up Instructions: No follow-ups on file.   Orders:  Orders Placed This Encounter  Procedures   MR SHOULDER RIGHT W CONTRAST   Arthrogram   No orders of the defined types were placed in this encounter.     Procedures: No procedures performed   Clinical Data: No additional findings.  Objective: Vital Signs: There were no vitals taken for this visit.  Physical Exam:  Constitutional: Patient appears well-developed HEENT:  Head: Normocephalic Eyes:EOM are normal Neck: Normal range of motion Cardiovascular: Normal rate Pulmonary/chest: Effort normal Neurologic: Patient is alert Skin: Skin is warm Psychiatric: Patient has normal mood and affect  Ortho Exam: Ortho exam demonstrates good cervical spine range of motion.  Does have some coarseness and grinding on the right with internal/external rotation of the arm.  Right shoulder range of motion 40/90/155.  Left shoulder range of motion 60/120/170.  5- out of 5 weakness to subscap testing on the right compared to 5+ out of 5 on the left.  No discrete AC joint tenderness right versus left.  He does have 5- out of 5 external rotation strength on the right  compared to the left.  More coarseness and grinding is present on the right with internal/external rotation at 90 degrees of abduction.  Negative apprehension relocation testing on the right-hand side.  Pec tendon is palpable.  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  Specialty Comments:  No specialty comments available.  Imaging: No results found.   PMFS History: There are no problems to display for this patient.  Past Medical History:  Diagnosis Date   Anxiety    Depression    GERD (gastroesophageal reflux disease)     Family History  Problem Relation Age of Onset   Heart attack Father     Past  Surgical History:  Procedure Laterality Date   SHOULDER OPEN ROTATOR CUFF REPAIR     Social History   Occupational History   Not on file  Tobacco Use   Smoking status: Never   Smokeless tobacco: Not on file  Substance and Sexual Activity   Alcohol use: Yes    Comment: occasionally   Drug use: No   Sexual activity: Not on file

## 2022-06-12 NOTE — Telephone Encounter (Signed)
Per notes in order for MRI this has been set up through [facility]

## 2022-06-12 NOTE — Telephone Encounter (Signed)
Per notes in referral this has been set up through [facility]

## 2022-06-30 ENCOUNTER — Ambulatory Visit
Admission: RE | Admit: 2022-06-30 | Discharge: 2022-06-30 | Disposition: A | Discharge status: Home / Self Care | Payer: Self-pay | Source: Ambulatory Visit | Attending: Orthopedic Surgery | Admitting: Orthopedic Surgery

## 2022-06-30 ENCOUNTER — Ambulatory Visit
Admission: RE | Admit: 2022-06-30 | Discharge: 2022-06-30 | Disposition: A | Discharge status: Home / Self Care | Payer: Worker's Compensation | Source: Ambulatory Visit | Attending: Orthopedic Surgery | Admitting: Orthopedic Surgery

## 2022-06-30 DIAGNOSIS — M25511 Pain in right shoulder: Secondary | ICD-10-CM

## 2022-06-30 MED ORDER — IOPAMIDOL (ISOVUE-M 200) INJECTION 41%
15.0000 mL | Freq: Once | INTRAMUSCULAR | Status: AC
  Administered 2022-06-30: 15 mL via INTRA_ARTICULAR

## 2022-07-02 ENCOUNTER — Telehealth: Payer: Self-pay | Admitting: Orthopedic Surgery

## 2022-07-02 NOTE — Telephone Encounter (Signed)
Pt called requesting to go over results of MRI over the phone Please call pt at [phone].

## 2022-07-06 NOTE — Telephone Encounter (Signed)
I called.  Can you set out a sheet for me to post him  today.  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 and it might take a week or 2 to get them to approve it but it is pretty clear-cut he needs to get fixed.  Thanks

## 2022-08-24 ENCOUNTER — Encounter: Payer: Self-pay | Admitting: Orthopedic Surgery

## 2022-08-24 ENCOUNTER — Other Ambulatory Visit: Payer: Self-pay | Admitting: Surgical

## 2022-08-24 DIAGNOSIS — M65812 Other synovitis and tenosynovitis, left shoulder: Secondary | ICD-10-CM

## 2022-08-24 DIAGNOSIS — M24011 Loose body in right shoulder: Secondary | ICD-10-CM

## 2022-08-24 DIAGNOSIS — M75121 Complete rotator cuff tear or rupture of right shoulder, not specified as traumatic: Secondary | ICD-10-CM

## 2022-08-24 MED ORDER — CELECOXIB 100 MG PO CAPS: 100.0000 mg | ORAL_CAPSULE | Freq: Two times a day (BID) | ORAL | 0 refills | Status: DC

## 2022-08-24 MED ORDER — METHOCARBAMOL 500 MG PO TABS: 500.0000 mg | ORAL_TABLET | Freq: Three times a day (TID) | ORAL | 1 refills | Status: DC | PRN

## 2022-08-24 MED ORDER — OXYCODONE HCL 5 MG PO TABS: 5.0000 mg | ORAL_TABLET | ORAL | 0 refills | Status: DC | PRN

## 2022-08-25 ENCOUNTER — Telehealth: Payer: Self-pay | Admitting: Orthopedic Surgery

## 2022-08-25 NOTE — Telephone Encounter (Signed)
Patient WC agent called in stating he does not have a "Rehab Device" please advise had surgery yesterday please advise  This is with WC

## 2022-08-25 NOTE — Telephone Encounter (Signed)
I talked with patient yesterday after surgery. Doesn't need CPM at this time. Just stay in sling, no lifting or AROM

## 2022-08-28 ENCOUNTER — Telehealth: Payer: Self-pay

## 2022-08-28 NOTE — Telephone Encounter (Signed)
-----   Message from Bridgett Larsson sent at 08/28/2022 11:32 AM EDT ----- Regarding: SECURE: CPM machine ordered?    WC Russell Cross  Hey checking on equipment for Russell Cross. Received phone call from [facility] questioning the CPM equipment being delivered. I had relate to [facility] that Russell Cross needs to stay in sling with no lifting or AROM and no equipment was ordered at this time. Did that change.  I did not see a order in chart so if CPM machine was ordered WC did not give me authorization as of yet.  Can you please advise on.  Thx Tisha

## 2022-08-28 NOTE — Telephone Encounter (Signed)
Order was sent to [facility] 07/21/22

## 2022-08-28 NOTE — Telephone Encounter (Signed)
Are you able to clarify if this was ordered?

## 2022-08-31 ENCOUNTER — Encounter: Payer: Self-pay | Admitting: Surgical

## 2022-08-31 ENCOUNTER — Ambulatory Visit (INDEPENDENT_AMBULATORY_CARE_PROVIDER_SITE_OTHER): Payer: Worker's Compensation | Admitting: Surgical

## 2022-08-31 DIAGNOSIS — Z9889 Other specified postprocedural states: Secondary | ICD-10-CM

## 2022-08-31 NOTE — Telephone Encounter (Signed)
Probably not because I did not know it was WC.

## 2022-08-31 NOTE — Progress Notes (Signed)
   Post-Op Visit Note   Patient: Russell Cross           Date of Birth: Jun 30, 1960           MRN: [id number] Visit Date: 08/31/2022 PCP: Hadley Pen, MD   Assessment & Plan:  Chief Complaint:  Chief Complaint  Patient presents with   Right Shoulder - Routine Post Op    08/24/22 right shoulder scope with deb and revision of RCTR   Visit Diagnoses: No diagnosis found.  Plan: Russell Cross is a 62 y.o. male who presents s/p right shoulder rotator cuff repair and biceps tenodesis on 08/24/2022.  Patient is doing well and pain is overall controlled.  Up to 70 degrees on CPM machine.  Denies any chest pain, SOB, fevers, chills.  Not having to take any pain medication, opioid or otherwise.  Has not had any pain medication since Saturday.  He is able to sleep in the bed without much difficulty at this point.  He is in sling at all times.  On exam, patient has range of motion 15 degrees external rotation, 65 degrees abduction, 90 degrees forward elevation passively.  Intact EPL, FPL, finger abduction, finger adduction, pronation/supination, bicep, tricep, deltoid of operative extremity.  Axillary nerve intact with deltoid firing.  Incisions are healing well without evidence of infection or dehiscence.  Sutures removed and replaced with Steri-Strips today.  2+ radial pulse of the operative extremity  Plan is continue with CPM machine.  No lifting with the operative extremity or any active range of motion of the operative shoulder.  Continue sling at all times aside from when using CPM or in shower.  Follow-up in 2 weeks for clinical recheck with Dr. August Saucer..   Follow-Up Instructions: No follow-ups on file.   Orders:  No orders of the defined types were placed in this encounter.  No orders of the defined types were placed in this encounter.   Imaging: No results found.  PMFS History: There are no problems to display for this patient.  Past Medical History:  Diagnosis Date    Anxiety    Depression    GERD (gastroesophageal reflux disease)     Family History  Problem Relation Age of Onset   Heart attack Father     Past Surgical History:  Procedure Laterality Date   SHOULDER OPEN ROTATOR CUFF REPAIR     Social History   Occupational History   Not on file  Tobacco Use   Smoking status: Never   Smokeless tobacco: Not on file  Substance and Sexual Activity   Alcohol use: Yes    Comment: occasionally   Drug use: No   Sexual activity: Not on file

## 2022-09-16 ENCOUNTER — Ambulatory Visit (INDEPENDENT_AMBULATORY_CARE_PROVIDER_SITE_OTHER): Payer: Worker's Compensation | Admitting: Orthopedic Surgery

## 2022-09-16 DIAGNOSIS — Z9889 Other specified postprocedural states: Secondary | ICD-10-CM

## 2022-09-18 ENCOUNTER — Encounter: Payer: Self-pay | Admitting: Orthopedic Surgery

## 2022-09-18 NOTE — Progress Notes (Unsigned)
   Post-Op Visit Note   Patient: Russell Cross           Date of Birth: April 26, 1961           MRN: [id number] Visit Date: 09/16/2022 PCP: Hadley Pen, MD   Assessment & Plan:  Chief Complaint:  Chief Complaint  Patient presents with   Right Shoulder - Routine Post Op     right shoulder rotator cuff repair and biceps tenodesis on 08/24/2022   Visit Diagnoses:  1. S/P right rotator cuff repair     Plan: Ashford is a 62 year old patient who underwent right shoulder arthroscopy with rotator cuff repair biceps tenodesis on 08/24/2022.  Describes some anterior shoulder pain.  Still having a little bit of numbness and tingling in the thumb index and middle finger.  I think that is likely related to the block he received.  In the CPM machine at 110.  On examination he has a little bit more grinding than I would expect.  I think he is okay for the 0 turn lawnmower.  Physical therapy to start but no strengthening for at least 2 weeks.  Op note provided.  Out of work for least another 4 weeks.  4-week return.  Follow-Up Instructions: Return in about 4 weeks (around 10/14/2022).   Orders:  No orders of the defined types were placed in this encounter.  No orders of the defined types were placed in this encounter.   Imaging: No results found.  PMFS History: There are no problems to display for this patient.  Past Medical History:  Diagnosis Date   Anxiety    Depression    GERD (gastroesophageal reflux disease)     Family History  Problem Relation Age of Onset   Heart attack Father     Past Surgical History:  Procedure Laterality Date   SHOULDER OPEN ROTATOR CUFF REPAIR     Social History   Occupational History   Not on file  Tobacco Use   Smoking status: Never   Smokeless tobacco: Not on file  Substance and Sexual Activity   Alcohol use: Yes    Comment: occasionally   Drug use: No   Sexual activity: Not on file

## 2022-10-14 ENCOUNTER — Ambulatory Visit (INDEPENDENT_AMBULATORY_CARE_PROVIDER_SITE_OTHER): Payer: Worker's Compensation | Admitting: Orthopedic Surgery

## 2022-10-14 ENCOUNTER — Encounter: Payer: Self-pay | Admitting: Orthopedic Surgery

## 2022-10-14 DIAGNOSIS — Z9889 Other specified postprocedural states: Secondary | ICD-10-CM

## 2022-10-14 NOTE — Progress Notes (Signed)
   Post-Op Visit Note   Patient: Russell Cross           Date of Birth: 01-Apr-1961           MRN: [id number] Visit Date: 10/14/2022 PCP: Hadley Pen, MD   Assessment & Plan:  Chief Complaint:  Chief Complaint  Patient presents with   Right Shoulder - Routine Post Op      right shoulder rotator cuff repair and biceps tenodesis on 08/24/2022     Visit Diagnoses:  1. S/P right rotator cuff repair     Plan: Russell Cross is a 62 year old patient who is now about 6 weeks out to 7 weeks out right shoulder arthroscopy with rotator cuff repair.  Started strengthening about a week ago.  Has some occasional popping in the shoulder.  Does physical work as a Production designer, theatre/television/film person.  Does also do a lot of work on his knees with stretching and using wrenches.  On examination he is got improving range of motion with some popping with passive range of motion of the shoulder at 90 degrees of abduction.  External rotation strength is improving.  Range of motion is about 35 degrees of external rotation which is improved.  Plan at this time is physical therapy prescription provided.  Out of work for at least 6 more weeks.  5-week return for repeat clinical check.  Follow-Up Instructions: No follow-ups on file.   Orders:  No orders of the defined types were placed in this encounter.  No orders of the defined types were placed in this encounter.   Imaging: No results found.  PMFS History: There are no problems to display for this patient.  Past Medical History:  Diagnosis Date   Anxiety    Depression    GERD (gastroesophageal reflux disease)     Family History  Problem Relation Age of Onset   Heart attack Father     Past Surgical History:  Procedure Laterality Date   SHOULDER OPEN ROTATOR CUFF REPAIR     Social History   Occupational History   Not on file  Tobacco Use   Smoking status: Never   Smokeless tobacco: Not on file  Substance and Sexual Activity   Alcohol use: Yes     Comment: occasionally   Drug use: No   Sexual activity: Not on file

## 2022-11-18 ENCOUNTER — Encounter: Payer: Self-pay | Admitting: Orthopedic Surgery

## 2022-11-23 ENCOUNTER — Ambulatory Visit (INDEPENDENT_AMBULATORY_CARE_PROVIDER_SITE_OTHER): Payer: Worker's Compensation | Admitting: Orthopedic Surgery

## 2022-11-23 DIAGNOSIS — Z9889 Other specified postprocedural states: Secondary | ICD-10-CM

## 2022-11-24 ENCOUNTER — Encounter: Payer: Self-pay | Admitting: Orthopedic Surgery

## 2022-11-24 NOTE — Progress Notes (Signed)
   Post-Op Visit Note   Patient: Russell Cross           Date of Birth: 12-Jul-1960           MRN: [id number] Visit Date: 11/23/2022 PCP: Hadley Pen, MD   Assessment & Plan:  Chief Complaint:  Chief Complaint  Patient presents with   Right Shoulder - Routine Post Op   Visit Diagnoses:  1. S/P right rotator cuff repair     Plan: Lamart is now about 3 months out right shoulder rotator cuff tear repair and biceps tenodesis.  He states he is getting there based on strength.  On exam he has good range of motion but still little crepitus over 90 degrees.  Strength is improving but still is 5- out of 5 on the right compared to 5+ out of 5 on the left.  Having little anterior pain as well.  Based on the physical nature of his job continue therapy 2 times a week for 6 more weeks.  Out of work for 6 more weeks with 5-week return.  May consider getting an FCE at that time.  Follow-Up Instructions: Return in about 5 weeks (around 12/28/2022).   Orders:  No orders of the defined types were placed in this encounter.  No orders of the defined types were placed in this encounter.   Imaging: No results found.  PMFS History: There are no problems to display for this patient.  Past Medical History:  Diagnosis Date   Anxiety    Depression    GERD (gastroesophageal reflux disease)     Family History  Problem Relation Age of Onset   Heart attack Father     Past Surgical History:  Procedure Laterality Date   SHOULDER OPEN ROTATOR CUFF REPAIR     Social History   Occupational History   Not on file  Tobacco Use   Smoking status: Never   Smokeless tobacco: Not on file  Substance and Sexual Activity   Alcohol use: Yes    Comment: occasionally   Drug use: No   Sexual activity: Not on file

## 2022-12-28 ENCOUNTER — Telehealth: Payer: Self-pay

## 2022-12-28 ENCOUNTER — Encounter: Payer: Self-pay | Admitting: Orthopedic Surgery

## 2022-12-28 ENCOUNTER — Ambulatory Visit (INDEPENDENT_AMBULATORY_CARE_PROVIDER_SITE_OTHER): Payer: Worker's Compensation | Admitting: Orthopedic Surgery

## 2022-12-28 DIAGNOSIS — Z9889 Other specified postprocedural states: Secondary | ICD-10-CM | POA: Diagnosis not present

## 2022-12-28 NOTE — Telephone Encounter (Signed)
W/c auth needed for FCE referral placed today

## 2022-12-28 NOTE — Progress Notes (Signed)
   Office Visit Note   Patient: Russell Cross           Date of Birth: 12-10-1960           MRN: [id number] Visit Date: 12/28/2022 Requested by: Hadley Pen, MD [address] [place],  [place] [address] PCP: Hadley Pen, MD  Subjective: Chief Complaint  Patient presents with   Right Shoulder - Follow-up    HPI: Russell Cross is a 62 y.o. male who presents to the office reporting right shoulder pain.  Had right shoulder arthroscopy with revision of rotator cuff tear repair on 08/24/2022.  Overall he is doing better.  Missed therapy on a couple of occasions due to illness.  He is now 4 months out.  He works as a Psychologist, occupational.  Therapy has communicated that he has met most of his goals.  He is able to use a leaf blower for about 10 minutes..                ROS: All systems reviewed are negative as they relate to the chief complaint within the history of present illness.  Patient denies fevers or chills.  Assessment & Plan: Visit Diagnoses:  1. S/P right rotator cuff repair     Plan: Impression is improving strength and endurance with the right shoulder.  Less crepitus today.  Strength is also improving.  Needs FCE with return office visit for rating and release.  Anticipate this process to take about 3 weeks.  Out of work note for 3 weeks pending FCE provided.  Follow-Up Instructions: No follow-ups on file.   Orders:  Orders Placed This Encounter  Procedures   Ambulatory referral to Physical Therapy   No orders of the defined types were placed in this encounter.     Procedures: No procedures performed   Clinical Data: No additional findings.  Objective: Vital Signs: There were no vitals taken for this visit.  Physical Exam:  Constitutional: Patient appears well-developed HEENT:  Head: Normocephalic Eyes:EOM are normal Neck: Normal range of motion Cardiovascular: Normal rate Pulmonary/chest: Effort normal Neurologic: Patient is alert Skin: Skin  is warm Psychiatric: Patient has normal mood and affect  Ortho Exam: Ortho exam demonstrates range of motion of of 60/100/170.  Rotator cuff strength is improving to 5 out of 5 on the right 5+ out of 5 on the left.  Subscap strength 5+ out of 5 bilaterally.  Decreasing crepitus with passive range of motion both at 15 degrees of abduction as well as with internal and external rotation at 90 degrees of abduction.  Specialty Comments:  No specialty comments available.  Imaging: No results found.   PMFS History: There are no problems to display for this patient.  Past Medical History:  Diagnosis Date   Anxiety    Depression    GERD (gastroesophageal reflux disease)     Family History  Problem Relation Age of Onset   Heart attack Father     Past Surgical History:  Procedure Laterality Date   SHOULDER OPEN ROTATOR CUFF REPAIR     Social History   Occupational History   Not on file  Tobacco Use   Smoking status: Never   Smokeless tobacco: Not on file  Substance and Sexual Activity   Alcohol use: Yes    Comment: occasionally   Drug use: No   Sexual activity: Not on file

## 2023-01-20 ENCOUNTER — Ambulatory Visit: Payer: Self-pay | Admitting: Orthopedic Surgery

## 2023-01-29 ENCOUNTER — Ambulatory Visit (INDEPENDENT_AMBULATORY_CARE_PROVIDER_SITE_OTHER): Payer: Worker's Compensation | Admitting: Orthopedic Surgery

## 2023-01-29 ENCOUNTER — Encounter: Payer: Self-pay | Admitting: Orthopedic Surgery

## 2023-01-29 DIAGNOSIS — Z9889 Other specified postprocedural states: Secondary | ICD-10-CM | POA: Diagnosis not present

## 2023-01-29 NOTE — Progress Notes (Signed)
Office Visit Note   Patient: Russell Cross           Date of Birth: 03/02/1961           MRN: [id number] Visit Date: 01/29/2023 Requested by: Hadley Pen, MD [address] [place],  [place] [address] PCP: Hadley Pen, MD  Subjective: Chief Complaint  Patient presents with   Other    WC DOI: 04-29-22 Re: Rt Shoulder Review FCE eval from 01-12-23    HPI: Russell Cross is a 62 y.o. male who presents to the office reporting right shoulder pain.  Since he was last seen has had FCE.  He was doing some lifting over the past week which caused him to have some mechanical symptoms in the shoulder.  He does describe some weakness with his arm with overhead motion on the right-hand side.  Pushing and pulling no problem.  FCE is reviewed..                ROS: All systems reviewed are negative as they relate to the chief complaint within the history of present illness.  Patient denies fevers or chills.  Assessment & Plan: Visit Diagnoses:  1. S/P right rotator cuff repair     Plan: Impression is okay to return back to work next week.  Patient gave very good effort on the FCE.  Agree with their assessment about 4 to waist lifting of 65 pounds.  Would like to restrict him to waist to shoulder lifting of 30 pounds and shoulder to overhead lifting of 20 pounds with both arms.  He will follow-up in 6 months for final clinical recheck.  However I do think he has reached maximal medical improvement and is rated at 20% of the right shoulder permanent partial disability due to mild weakness mild pain and some restricted functional activity.  Follow-up in 6 months.  Follow-Up Instructions: No follow-ups on file.   Orders:  No orders of the defined types were placed in this encounter.  No orders of the defined types were placed in this encounter.     Procedures: No procedures performed   Clinical Data: No additional findings.  Objective: Vital Signs: There were no  vitals taken for this visit.  Physical Exam:  Constitutional: Patient appears well-developed HEENT:  Head: Normocephalic Eyes:EOM are normal Neck: Normal range of motion Cardiovascular: Normal rate Pulmonary/chest: Effort normal Neurologic: Patient is alert Skin: Skin is warm Psychiatric: Patient has normal mood and affect  Ortho Exam: Ortho exam demonstrates mild crepitus with passive range of motion of that right arm.  He does have good range of motion below shoulder level.  Above shoulder level has a little weakness with resisted forward flexion and abduction.  Subscap strength 5+ out of 5.  Specialty Comments:  No specialty comments available.  Imaging: No results found.   PMFS History: There are no problems to display for this patient.  Past Medical History:  Diagnosis Date   Anxiety    Depression    GERD (gastroesophageal reflux disease)     Family History  Problem Relation Age of Onset   Heart attack Father     Past Surgical History:  Procedure Laterality Date   SHOULDER OPEN ROTATOR CUFF REPAIR     Social History   Occupational History   Not on file  Tobacco Use   Smoking status: Never   Smokeless tobacco: Not on file  Substance and Sexual Activity   Alcohol use:  Yes    Comment: occasionally   Drug use: No   Sexual activity: Not on file

## 2023-02-17 ENCOUNTER — Telehealth: Payer: Self-pay

## 2023-02-17 ENCOUNTER — Ambulatory Visit (INDEPENDENT_AMBULATORY_CARE_PROVIDER_SITE_OTHER): Payer: Worker's Compensation | Admitting: Orthopedic Surgery

## 2023-02-17 ENCOUNTER — Other Ambulatory Visit (INDEPENDENT_AMBULATORY_CARE_PROVIDER_SITE_OTHER): Payer: Worker's Compensation

## 2023-02-17 DIAGNOSIS — M25511 Pain in right shoulder: Secondary | ICD-10-CM

## 2023-02-17 NOTE — Telephone Encounter (Signed)
Auth needed for right shoulder MRI

## 2023-02-18 ENCOUNTER — Encounter: Payer: Self-pay | Admitting: Orthopedic Surgery

## 2023-02-18 ENCOUNTER — Encounter (HOSPITAL_BASED_OUTPATIENT_CLINIC_OR_DEPARTMENT_OTHER): Payer: Self-pay

## 2023-02-18 ENCOUNTER — Telehealth: Payer: Self-pay | Admitting: Orthopedic Surgery

## 2023-02-18 NOTE — Telephone Encounter (Signed)
Pt called requesting to return to work with light duty until MRI. Please call pt about this matter at [phone].

## 2023-02-18 NOTE — Telephone Encounter (Signed)
Note in chart 

## 2023-02-18 NOTE — Telephone Encounter (Signed)
Called pt, he will pick up in the morning. Do you mind printing this off for me?

## 2023-02-18 NOTE — Progress Notes (Signed)
Office Visit Note   Patient: Russell Cross           Date of Birth: 1960/06/12           MRN: [id number] Visit Date: 02/17/2023 Requested by: Hadley Pen, MD [address] [place],  [place] [address] PCP: Hadley Pen, MD  Subjective: Chief Complaint  Patient presents with   Right Shoulder - Pain    HPI: Russell Cross is a 62 y.o. male who presents to the office reporting right shoulder pain.  The patient underwent right shoulder rotator cuff tear repair but went back to work and on day 4 of his return to work he slipped on the wet floor in fell on his extended right arm.  Date of injury 02/01/2023.  Caught himself on his outstretched right arm.  Has had increased pain weakness and mechanical symptoms in the shoulder since this fall.  He does report swelling in the shoulder.  Took 1 oxycodone that he had left.  He does report significant loss of strength particularly with overhead motion.  This is a significant change compared to where he was when he went back to work..                ROS: All systems reviewed are negative as they relate to the chief complaint within the history of present illness.  Patient denies fevers or chills.  Assessment & Plan: Visit Diagnoses:  1. 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  Plan MRI arthrogram of the right shoulder to evaluate for recurrent rotator cuff tear.  Out of work for 4 weeks.  The patient also injured his right wrist and is having a little bit of pain which is slightly improved since the fall.  He will need repeat radiographs including a scaphoid view on return visit after his MRI scan.  Cautioned him against heavy lifting until we evaluate more fully the shoulder and the wrist.  Follow-Up Instructions: No follow-ups on file.   Orders:  Orders Placed This Encounter  Procedures   XR Shoulder Right   MR SHOULDER RIGHT W CONTRAST   Arthrogram    No orders of the defined types were placed in this encounter.     Procedures: No procedures performed   Clinical Data: No additional findings.  Objective: Vital Signs: There were no vitals taken for this visit.  Physical Exam:  Constitutional: Patient appears well-developed HEENT:  Head: Normocephalic Eyes:EOM are normal Neck: Normal range of motion Cardiovascular: Normal rate Pulmonary/chest: Effort normal Neurologic: Patient is alert Skin: Skin is warm Psychiatric: Patient has normal mood and affect  Ortho Exam: Ortho exam demonstrates increased crepitus in the right shoulder with passive and active range of motion.  Does have 4 out of 5 external rotation strength on the right compared to the left.  Subscap strength intact.  No Popeye deformity in the biceps region.  Does have some tenderness in the wrist joint but no focal snuffbox tenderness.  EPL FPL interosseous strength is intact.  No ecchymosis present in the shoulder girdle region.  Forward flexion and abduction strength definitely weaker overhead compared to prior visits.  Subscap strength 5+ out of 5.  Specialty Comments:  No specialty comments available.  Imaging: No results found.   PMFS History: There are no problems to display for this patient.  Past Medical History:  Diagnosis Date   Anxiety  Depression    GERD (gastroesophageal reflux disease)     Family History  Problem Relation Age of Onset   Heart attack Father     Past Surgical History:  Procedure Laterality Date   SHOULDER OPEN ROTATOR CUFF REPAIR     Social History   Occupational History   Not on file  Tobacco Use   Smoking status: Never   Smokeless tobacco: Not on file  Substance and Sexual Activity   Alcohol use: Yes    Comment: occasionally   Drug use: No   Sexual activity: Not on file

## 2023-02-18 NOTE — Telephone Encounter (Signed)
Ok for this with no lifting more than 5 lbs with right arm thx

## 2023-03-10 ENCOUNTER — Ambulatory Visit
Admission: RE | Admit: 2023-03-10 | Discharge: 2023-03-10 | Disposition: A | Discharge status: Home / Self Care | Payer: Self-pay | Source: Ambulatory Visit | Attending: Orthopedic Surgery | Admitting: Orthopedic Surgery

## 2023-03-10 ENCOUNTER — Ambulatory Visit
Admission: RE | Admit: 2023-03-10 | Discharge: 2023-03-10 | Disposition: A | Discharge status: Home / Self Care | Payer: Worker's Compensation | Source: Ambulatory Visit | Attending: Orthopedic Surgery | Admitting: Orthopedic Surgery

## 2023-03-10 DIAGNOSIS — M25511 Pain in right shoulder: Secondary | ICD-10-CM

## 2023-03-10 MED ORDER — IOPAMIDOL (ISOVUE-M 200) INJECTION 41%
15.0000 mL | Freq: Once | INTRAMUSCULAR | Status: AC
  Administered 2023-03-10: 15 mL via INTRA_ARTICULAR

## 2023-04-05 ENCOUNTER — Ambulatory Visit: Payer: Worker's Compensation | Admitting: Orthopedic Surgery

## 2023-04-05 ENCOUNTER — Telehealth: Payer: Self-pay

## 2023-04-05 ENCOUNTER — Other Ambulatory Visit (INDEPENDENT_AMBULATORY_CARE_PROVIDER_SITE_OTHER): Payer: Worker's Compensation

## 2023-04-05 DIAGNOSIS — M25531 Pain in right wrist: Secondary | ICD-10-CM

## 2023-04-05 NOTE — Telephone Encounter (Signed)
Auth needed from w/c for right wrist MRI

## 2023-04-09 ENCOUNTER — Encounter: Payer: Self-pay | Admitting: Orthopedic Surgery

## 2023-04-09 NOTE — Progress Notes (Signed)
Office Visit Note   Patient: Russell Cross           Date of Birth: 1961-01-17           MRN: [id number] Visit Date: 04/05/2023 Requested by: Hadley Pen, MD [address] [place],  [place] [address] PCP: Hadley Pen, MD  Subjective: Chief Complaint  Patient presents with   Right Shoulder - Follow-up    Review MRI  W/C DOI: 02/01/23    HPI: Russell Cross is a 62 y.o. male who presents to the office reporting continued right shoulder pain following a fall from an injury at work on 02/01/2023.  He also injured his left wrist at that time.  The wrist brace has not been helpful.  He feels like he is losing some motion in the wrist.  He is right-hand dominant.  He has been working but in a limited fashion with lifting restrictions.  His shoulder remains painful and weak.  MRI scan of the shoulder is reviewed.  Does show recurrent rotator cuff tear with significant retraction of the tendon but no muscle atrophy of the supraspinatus and infraspinatus.  There is retraction of the tendon medial to the superior aspect of the humeral head.  Also there is a full-thickness tear of the superior 50% of the subscap tendon..                ROS: All systems reviewed are negative as they relate to the chief complaint within the history of present illness.  Patient denies fevers or chills.  Assessment & Plan: Visit Diagnoses:  1. Pain in right wrist     Plan: Impression is recurrent rotator cuff tear which is worsened compared to prior MRI scan before his initial surgery.  His right wrist continues to be painful in the scaphoid region as well.  Radiographs today are normal.  With more than 6 weeks of right wrist pain with diminishing range of motion MRI scan is indicated to evaluate for occult fracture.  We need to post Chrissie Noa for revision rotator cuff tear repair and likely requiring cuff mend patch.  I think we we will be able to get a partial repair of that rotator cuff  tendon depending on how much she can mobilize.  May not be able to get a complete full watertight repair this go around.  Nonetheless I do think even a partial repair will help his strength and pain.  Risk and benefits are discussed.  All questions answered.  He should continue his current duty level at work which is less than 10 pound lifting with that right arm pending surgery.  I do want him to maintain as much passive range of motion as possible.  Follow-Up Instructions: No follow-ups on file.   Orders:  Orders Placed This Encounter  Procedures   XR Wrist Complete Right   MR Wrist Right w/o contrast   No orders of the defined types were placed in this encounter.     Procedures: No procedures performed   Clinical Data: No additional findings.  Objective: Vital Signs: There were no vitals taken for this visit.  Physical Exam:  Constitutional: Patient appears well-developed HEENT:  Head: Normocephalic Eyes:EOM are normal Neck: Normal range of motion Cardiovascular: Normal rate Pulmonary/chest: Effort normal Neurologic: Patient is alert Skin: Skin is warm Psychiatric: Patient has normal mood and affect  Ortho Exam: Ortho exam demonstrates wrist palmar flexion on the right 50 degrees and dorsiflexion to about  40 degrees.  This is about 20 to 30 degrees less then similar motions on the left-hand side.  Does have some snuffbox tenderness today and no ulnar-sided symptoms.  Negative Finkelstein's test on the right.  EPL FPL interosseous strength is intact.  Right shoulder demonstrates 5- out of 5 external rotation strength compared to the left-hand side.  Subscap strength does appear to be symmetric.  No Popeye deformity in the arm.  Does have some coarseness and grinding with passive range of motion at 90 degrees of abduction.  No other masses lymphadenopathy or skin changes noted in that shoulder girdle region.  Specialty Comments:  No specialty comments  available.  Imaging: No results found.   PMFS History: There are no problems to display for this patient.  Past Medical History:  Diagnosis Date   Anxiety    Depression    GERD (gastroesophageal reflux disease)     Family History  Problem Relation Age of Onset   Heart attack Father     Past Surgical History:  Procedure Laterality Date   SHOULDER OPEN ROTATOR CUFF REPAIR     Social History   Occupational History   Not on file  Tobacco Use   Smoking status: Never   Smokeless tobacco: Not on file  Substance and Sexual Activity   Alcohol use: Yes    Comment: occasionally   Drug use: No   Sexual activity: Not on file

## 2023-04-23 ENCOUNTER — Other Ambulatory Visit: Payer: Self-pay

## 2023-04-27 ENCOUNTER — Telehealth: Payer: Self-pay

## 2023-04-27 NOTE — Telephone Encounter (Signed)
-----   Message from Cathleen Corti sent at 04/26/2023 11:41 AM EST ----- Regarding: wrist Are we adding a wrist surgery to this patient's existing surgery order for the the right shoulder revision rotator cuff tear repair, patch augmentation? I will need to notify Tisha with WC.  We can schedule this for 05-13-23 at [facility], if Dr. August Saucer is ok doing solo Franky Macho is off).  The WC is wanting this surgery done ASAP.  Please advise.

## 2023-04-27 NOTE — Telephone Encounter (Signed)
At the surgery that would be the easier to do with [facility].  I cannot really address the wrist until he gets his MRI scan which is not scheduled until the 23rd.  unless he can get the wrist MRI scan done earlier we cannot say definitively whether or not the wrist needs to be included at the time of surgery

## 2023-04-27 NOTE — Telephone Encounter (Signed)
Dr August Saucer please advise on Russell Cross message regarding adding additional case to currently posted surgery.

## 2023-04-28 ENCOUNTER — Ambulatory Visit: Payer: Self-pay | Admitting: Orthopedic Surgery

## 2023-05-10 ENCOUNTER — Other Ambulatory Visit: Payer: Self-pay

## 2023-05-24 ENCOUNTER — Telehealth: Payer: Self-pay

## 2023-05-24 ENCOUNTER — Other Ambulatory Visit: Payer: Self-pay

## 2023-05-24 ENCOUNTER — Ambulatory Visit (INDEPENDENT_AMBULATORY_CARE_PROVIDER_SITE_OTHER): Payer: Worker's Compensation | Admitting: Orthopedic Surgery

## 2023-05-24 DIAGNOSIS — M25531 Pain in right wrist: Secondary | ICD-10-CM | POA: Diagnosis not present

## 2023-05-24 NOTE — Telephone Encounter (Signed)
 Patient saw Dr August Saucer today and he would like for patient to see Dr Fara Boros this week. Morrison Old can we get auth from w/c. Lequita Halt are we able to accommodate? 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

## 2023-05-25 ENCOUNTER — Encounter: Payer: Self-pay | Admitting: Orthopedic Surgery

## 2023-05-25 NOTE — Progress Notes (Signed)
 Office Visit Note   Patient: Russell Cross           Date of Birth: Aug 31, 1960           MRN: [id number] Visit Date: 05/24/2023 Requested by: Silver Lamar LABOR, MD [address] [place],  [place] [address] PCP: Silver Lamar LABOR, MD  Subjective: Chief Complaint  Patient presents with   Other     Review MRI right wrist    HPI: Russell Cross is a 63 y.o. male who presents to the office reporting continued right wrist pain.  Since he was last seen has had an MRI scan of the wrist.  I do not have those images available to review review but I do have the report and the report states that there is a fragmented dark lunate which may represent comminuted chronic fracture or complicated wound with toe malacia.  There is also partial thickness scapholunate ligament tear.  Whenever he uses his hand for an hour he has pain.  Localizes it dorsally and radially.  He is right-hand dominant.  Hurts him to grip.  Does have some tingling in digits 4 and 5.  His wrist was okay before his fall several months ago.  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  A compression wrap helps..                ROS: All systems reviewed are negative as they relate to the chief complaint within the history of present illness.  Patient denies fevers or chills.  Assessment & Plan: Visit Diagnoses:  1. Pain in right wrist     Plan: Impression is right wrist pain with problem with the lunate and possible's partial-thickness scapholunate ligament tear.  This is a complicated problem he is also having some ulnar nerve symptoms in digits 4 and 5.  Plan is to refer to Dr. Brutus this week for evaluation of partial scapholunate ligament tear and possible lunate AVN.  He will follow-up with us  once he gets all this wrist problem resolved in terms of scheduling his necessary shoulder surgery  Follow-Up Instructions: No follow-ups on file.   Orders:  No orders  of the defined types were placed in this encounter.  No orders of the defined types were placed in this encounter.     Procedures: No procedures performed   Clinical Data: No additional findings.  Objective: Vital Signs: There were no vitals taken for this visit.  Physical Exam:  Constitutional: Patient appears well-developed HEENT:  Head: Normocephalic Eyes:EOM are normal Neck: Normal range of motion Cardiovascular: Normal rate Pulmonary/chest: Effort normal Neurologic: Patient is alert Skin: Skin is warm Psychiatric: Patient has normal mood and affect  Ortho Exam: Ortho exam demonstrates pain with dorsiflexion of the right wrist compared to the left.  Overall he is lost about 5 to 10 degrees of palmar flexion as well as dorsiflexion.  Grip strength is less on the right than the left.  Does have dorsal tenderness.  No snuffbox tenderness is present.  EPL FPL interosseous strength is intact.  Negative Tinel's cubital tunnel with no subluxation of the ulnar nerve on the right-hand side.  Interosseous strength is symmetric right versus left  Specialty Comments:  No specialty comments available.  Imaging: No results found.   PMFS History: There are no active problems to display for this patient.  Past Medical History:  Diagnosis Date   Anxiety  Depression    GERD (gastroesophageal reflux disease)     Family History  Problem Relation Age of Onset   Heart attack Father     Past Surgical History:  Procedure Laterality Date   SHOULDER OPEN ROTATOR CUFF REPAIR     Social History   Occupational History   Not on file  Tobacco Use   Smoking status: Never   Smokeless tobacco: Not on file  Substance and Sexual Activity   Alcohol use: Yes    Comment: occasionally   Drug use: No   Sexual activity: Not on file

## 2023-05-26 NOTE — Telephone Encounter (Signed)
 Thanks

## 2023-06-01 ENCOUNTER — Other Ambulatory Visit (INDEPENDENT_AMBULATORY_CARE_PROVIDER_SITE_OTHER): Payer: Worker's Compensation

## 2023-06-01 ENCOUNTER — Ambulatory Visit (INDEPENDENT_AMBULATORY_CARE_PROVIDER_SITE_OTHER): Payer: Worker's Compensation | Admitting: Orthopedic Surgery

## 2023-06-01 DIAGNOSIS — M25531 Pain in right wrist: Secondary | ICD-10-CM

## 2023-06-01 NOTE — Progress Notes (Signed)
 AWS SHERE - 63 y.o. male MRN [id number]  Date of birth: 02-22-1961  Office Visit Note: Visit Date: 06/01/2023 PCP: Silver Lamar LABOR, MD Referred by: Silver Lamar LABOR, MD  Subjective: No chief complaint on file.  HPI: HESTER FORGET is a pleasant 63 y.o. male who presents today for evaluation of ongoing right wrist pain with associated intermittent swelling.  He is also describing numbness throughout the right hand that is progressive in nature.  He had an injury in September of this year with a mechanical fall onto the outstretched right wrist with associated pain.  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  Pertinent ROS were reviewed with the patient and found to be negative unless otherwise specified above in HPI.   Visit Reason: right wrist pain/numbness Duration of symptoms:September 2024 Hand dominance: right Occupation: Maintenance Diabetic: No Smoking: No Heart/Lung History: none Blood Thinners:  none  Prior Testing/EMG: xrays 04/09/23 Injections (Date): none Treatments: none Prior Surgery:none  FOOSH injury at work; also being seen for shoulder by Dr. Addie   Assessment & Plan: Visit Diagnoses:  1. Pain in right wrist     Plan: Extensive discussion was had with the patient today regarding his right wrist and hand complaints.  I reviewed in detail the report of his MRI, the images are unavailable to me, bilateral pronated grip x-rays were obtained today as well.  As for the scapholunate ligament injury, he does not have any significant diastases at the scapholunate interval, there is no significant instability on his clinical examination today.  At this juncture, I do not feel that he requires any significant repair or reconstruction of the scapholunate ligament.  Instead, we would want to immobilize the right wrist in order to allow healing moving forward.  To date, he  has not been in any type of wrist brace or immobilization which we will plan on instituting today.  As for work, this does appear to be reaggravating his right wrist with repetitive motion and grip, we will place restriction from a work standpoint today for no use of the right hand and the brace currently to allow for healing of the scapholunate ligament injury.  As far as the numbness and tingling in the right hand, this does create concern for potential ongoing nerve compression to the median and ulnar nerve distributions.  I would like him to obtain an electrodiagnostic study of the right upper extremity in order to better delineate any potential nerve compression in these regions for me to help guide treatment.  We discussed the underlying pathophysiology of nerve compression as well as potential treatments ranging from conservative to surgical.  He will obtain the right upper extremity EMG in the near future and return to me in order to review results and discuss next steps.  At that juncture, I can better recommend my treatment plan so that the shoulder treatment with Dr. Addie can be appropriately planned.  Wrist brace was fitted today for the right wrist.  Patient tolerated well.  Greater than 45 minutes was spent reviewing previous imaging, records, documentation and reports.  As well as in interpretation of data, examination and discussion with patient regarding further treatment plan.  Follow-up: No follow-ups on file.   Meds & Orders: No orders of the defined types were placed in this encounter.   Orders Placed This Encounter  Procedures   XR Wrist Complete Right   Ambulatory  referral to Physical Medicine Rehab     Procedures: No procedures performed      Clinical History: No specialty comments available.  He reports that he has never smoked. He does not have any smokeless tobacco history on file. No results for input(s): HGBA1C, LABURIC in the last 8760 hours.  Objective:    Vital Signs: There were no vitals taken for this visit.  Physical Exam  Gen: Well-appearing, in no acute distress; non-toxic CV: Regular Rate. Well-perfused. Warm.  Resp: Breathing unlabored on room air; no wheezing. Psych: Fluid speech in conversation; appropriate affect; normal thought process  Ortho Exam PHYSICAL EXAM:  General: Patient is well appearing and in no distress. Cervical spine mobility is full in all directions:  Skin and Muscle: No significant skin changes are apparent to upper extremities.  Muscle bulk and contour normal, no signs of atrophy.     Range of Motion and Palpation Tests: Mobility is full about the elbows with flexion and extension.  Forearm supination and pronation are 85/85 bilaterally.  Wrist flexion/extension is 75/65 left side, right side 55/45 (limited secondary to stiffness and pain).  Digital flexion and extension are full.  Thumb opposition is full to the base of the small fingers bilaterally.    No cords or nodules are palpated.  No triggering is observed.    There is tenderness over the scapholunate interval of the right wrist.  No evidence of instability with Mathew shift testing, negative clunk, moderate pain at the right wrist.  No evidence of radiocarpal, midcarpal or intercarpal joint instability with provocation of the left wrist.  Neurologic, Vascular, Motor: Sensation is slightly diminished to light touch in the ulnar nerve distribution of the right hand, remains fairly well-preserved in the median nerve distribution of the right hand.  Positive Tinel's over the right carpal tunnel region, positive Phalen's right Fingers pink and well perfused.  Capillary refill is brisk.    Small finger FDP 5/5, negative Froment's right side    No results found for: HGBA1C   Imaging: XR Wrist Complete Right Result Date: 06/01/2023 Bilateral pronated grip views were obtained today, no evidence of significant scapholunate diastases bilaterally.   Ulnar neutral variance bilaterally as well.   Past Medical/Family/Surgical/Social History: Medications & Allergies reviewed per EMR, new medications updated. There are no active problems to display for this patient.  Past Medical History:  Diagnosis Date   Anxiety    Depression    GERD (gastroesophageal reflux disease)    Family History  Problem Relation Age of Onset   Heart attack Father    Past Surgical History:  Procedure Laterality Date   SHOULDER OPEN ROTATOR CUFF REPAIR     Social History   Occupational History   Not on file  Tobacco Use   Smoking status: Never   Smokeless tobacco: Not on file  Substance and Sexual Activity   Alcohol use: Yes    Comment: occasionally   Drug use: No   Sexual activity: Not on file    Tobe Kervin Afton Alderton, M.D. [facility] 11:53 AM

## 2023-06-21 ENCOUNTER — Ambulatory Visit (INDEPENDENT_AMBULATORY_CARE_PROVIDER_SITE_OTHER): Payer: Worker's Compensation | Admitting: Orthopedic Surgery

## 2023-06-21 DIAGNOSIS — G5601 Carpal tunnel syndrome, right upper limb: Secondary | ICD-10-CM

## 2023-06-21 NOTE — Progress Notes (Signed)
Russell Cross - 63 y.o. male MRN [id number]  Date of birth: 1960-07-16  Office Visit Note: Visit Date: 06/21/2023 PCP: Hadley Pen, MD Referred by: Hadley Pen, MD  Subjective: No chief complaint on file.  HPI: Russell Cross is a pleasant 63 y.o. male who presents today for follow-up of ongoing right wrist pain with associated numbness throughout the right hand that is progressive in nature.    He had an injury in September of this year with a mechanical fall onto the outstretched right wrist with associated pain.  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  This is being treated nonoperatively with bracing.  To further investigate the associated numbness throughout the hand, he underwent recent electrodiagnostic study of the right upper extremity which did confirm bilateral carpal tunnel syndrome, right greater than left.  There is also an element of multilevel cervical radiculopathy.  He has been doing bracing for the right wrist as instructed without significant relief in his symptoms.  Activity modification is also not significantly helped his symptoms.  He is describing nocturnal symptoms as well.  Pertinent ROS were reviewed with the patient and found to be negative unless otherwise specified above in HPI.    Assessment & Plan: Visit Diagnoses:  No diagnosis found.   Plan: Extensive discussion was had with the patient today about his ongoing right sided carpal tunnel syndrome that is refractory to conservative care.  Patient has both clinical and electrodiagnostic evidence to confirm this diagnosis.  We discussed the possibility for ongoing bracing, activity modification, injections and potential therapy.  Given that his numbness is progressive in nature, he is interested in surgical discussion.    At this juncture, he is indicated for right open versus endoscopic carpal  tunnel release.  Risks and benefits of the operations were discussed in detail today as well as forms of anesthesia.  Understanding all risks and benefits, patient would like to have surgery done in the form of right open carpal tunnel release under local anesthesia.  Risks include but not limited to infection, bleeding, scarring, stiffness, nerve injury or vascular, tendon injury, risk of recurrence and need for subsequent operation were all discussed in detail.  Patient consented understanding the above.  Will move forward surgical scheduling.   Follow-up: No follow-ups on file.   Meds & Orders: No orders of the defined types were placed in this encounter.   No orders of the defined types were placed in this encounter.    Procedures: No procedures performed      Clinical History: No specialty comments available.  He reports that he has never smoked. He does not have any smokeless tobacco history on file. No results for input(s): "HGBA1C", "LABURIC" in the last 8760 hours.  Objective:   Vital Signs: There were no vitals taken for this visit.  Physical Exam  Gen: Well-appearing, in no acute distress; non-toxic CV: Regular Rate. Well-perfused. Warm.  Resp: Breathing unlabored on room air; no wheezing. Psych: Fluid speech in conversation; appropriate affect; normal thought process  Ortho Exam PHYSICAL EXAM:  General: Patient is well appearing and in no distress. Cervical spine mobility is full in all directions:  Skin and Muscle: No significant skin changes are apparent to upper extremities.  Muscle bulk and contour normal, no signs of atrophy.     Range of Motion and Palpation Tests: Mobility is full about the elbows with flexion and  extension.  Forearm supination and pronation are 85/85 bilaterally.  Wrist flexion/extension is 75/65 left side, right side 55/45 (limited secondary to stiffness and pain).  Digital flexion and extension are full.  Thumb opposition is full to the  base of the small fingers bilaterally.    No cords or nodules are palpated.  No triggering is observed.    There is moderate tenderness over the scapholunate interval of the right wrist.  No evidence of instability with Claudette Laws shift testing, negative clunk, moderate pain at the right wrist.  No evidence of radiocarpal, midcarpal or intercarpal joint instability with provocation of the left wrist.  Neurologic, Vascular, Motor: Sensation is diminished to light touch in median nerve distribution of the right hand, slightly diminished to light touch in ulnar nerve distribution  2-point discrimination in the right median nerve distribution is between 7 and 8 mm.  Positive Tinel's over the right carpal tunnel region, positive Phalen's right Fingers pink and well perfused.  Capillary refill is brisk.    Small finger FDP 5/5, negative Froment's right side    No results found for: "HGBA1C"   Imaging: No results found.   Past Medical/Family/Surgical/Social History: Medications & Allergies reviewed per EMR, new medications updated. There are no active problems to display for this patient.  Past Medical History:  Diagnosis Date   Anxiety    Depression    GERD (gastroesophageal reflux disease)    Family History  Problem Relation Age of Onset   Heart attack Father    Past Surgical History:  Procedure Laterality Date   SHOULDER OPEN ROTATOR CUFF REPAIR     Social History   Occupational History   Not on file  Tobacco Use   Smoking status: Never   Smokeless tobacco: Not on file  Substance and Sexual Activity   Alcohol use: Yes    Comment: occasionally   Drug use: No   Sexual activity: Not on file    Omari Mcmanaway Trevor Mace, M.D. [facility] 3:24 PM

## 2023-06-30 ENCOUNTER — Ambulatory Visit (INDEPENDENT_AMBULATORY_CARE_PROVIDER_SITE_OTHER): Payer: Worker's Compensation | Admitting: Orthopedic Surgery

## 2023-06-30 DIAGNOSIS — Z9889 Other specified postprocedural states: Secondary | ICD-10-CM | POA: Diagnosis not present

## 2023-07-02 ENCOUNTER — Encounter: Payer: Self-pay | Admitting: Orthopedic Surgery

## 2023-07-02 NOTE — Progress Notes (Signed)
Office Visit Note   Patient: Russell Cross           Date of Birth: 02/08/1961           MRN: [id number] Visit Date: 06/30/2023 Requested by: Hadley Pen, MD [address] [place],  [place] [address] PCP: Hadley Pen, MD  Subjective: Chief Complaint  Patient presents with   Other    Ready to proceed with scheduling surgery for shoulder    HPI: Russell Cross is a 63 y.o. male who presents to the office reporting continued right shoulder pain.  Patient underwent rotator cuff repair and had a fall about 3 to 4 months after his surgery.  In that fall he did sustain recurrent rotator cuff tearing.  Reports continued pain and weakness in the right shoulder.  The rotator cuff tear has retracted approximately to the top of the humeral head.  Not too much muscle atrophy in the supraspinatus or infraspinatus muscle belly at this time.  He reports continued weakness in the arm particular with overhead activities.  He has been able to maintain his passive range of motion..                ROS: All systems reviewed are negative as they relate to the chief complaint within the history of present illness.  Patient denies fevers or chills.  Assessment & Plan: Visit Diagnoses:  1. S/P right rotator cuff repair     Plan: Impression is recurrent rotator cuff tear in a young active patient who does not really have much arthritis in the shoulder joint.  Plan at this time would be attempt at rotator cuff repair with cuff mend augmentation.  It has been about 5 months since his MRI scan and it is possible that the cuff has retracted more and may not be repairable at least in a watertight fashion.  Nonetheless for someone this age I think at least a partial repair is indicated to restore the force couple between anterior and posterior rotator cuff.  The risk and benefits of the procedure discussed with the patient include not limited to infection or vessel damage incomplete pain relief as well  as incomplete restoration of function.  Patient understands risk benefits and wishes to proceed.  Shoulder CPM machine indicated for 2 to 4 weeks after surgery to maintain passive range of motion.  Anticipate out of work time from this surgery to be on the order of 3 months.  Follow-Up Instructions: No follow-ups on file.   Orders:  No orders of the defined types were placed in this encounter.  No orders of the defined types were placed in this encounter.     Procedures: No procedures performed   Clinical Data: No additional findings.  Objective: Vital Signs: There were no vitals taken for this visit.  Physical Exam:  Constitutional: Patient appears well-developed HEENT:  Head: Normocephalic Eyes:EOM are normal Neck: Normal range of motion Cardiovascular: Normal rate Pulmonary/chest: Effort normal Neurologic: Patient is alert Skin: Skin is warm Psychiatric: Patient has normal mood and affect  Ortho Exam: Ortho exam demonstrates good cervical spine range of motion.  Continue to have wrist pain on the right-hand side which has been evaluated by our hand surgeon.  He does have 5- out of 5 external rotation strength on the right compared to 5 out of 5 on the left.  There is some coarseness with passive range of motion at 90 degrees of abduction.  Subscap strength intact at  5 out of 5.  No Popeye deformity present.  No discrete AC joint tenderness present on the right-hand side.  Specialty Comments:  No specialty comments available.  Imaging: No results found.   PMFS History: There are no active problems to display for this patient.  Past Medical History:  Diagnosis Date   Anxiety    Depression    GERD (gastroesophageal reflux disease)     Family History  Problem Relation Age of Onset   Heart attack Father     Past Surgical History:  Procedure Laterality Date   SHOULDER OPEN ROTATOR CUFF REPAIR     Social History   Occupational History   Not on file  Tobacco  Use   Smoking status: Never   Smokeless tobacco: Not on file  Substance and Sexual Activity   Alcohol use: Yes    Comment: occasionally   Drug use: No   Sexual activity: Not on file

## 2023-07-08 ENCOUNTER — Encounter: Payer: Self-pay | Admitting: Orthopedic Surgery

## 2023-08-02 ENCOUNTER — Encounter: Payer: Self-pay | Admitting: Orthopedic Surgery

## 2023-08-02 ENCOUNTER — Other Ambulatory Visit: Payer: Self-pay | Admitting: Surgical

## 2023-08-02 DIAGNOSIS — S46011A Strain of muscle(s) and tendon(s) of the rotator cuff of right shoulder, initial encounter: Secondary | ICD-10-CM | POA: Diagnosis not present

## 2023-08-02 MED ORDER — CELECOXIB 100 MG PO CAPS: 100.0000 mg | ORAL_CAPSULE | Freq: Two times a day (BID) | ORAL | 0 refills | Status: DC

## 2023-08-02 MED ORDER — CEPHALEXIN 500 MG PO CAPS: 1000.0000 mg | ORAL_CAPSULE | Freq: Once | ORAL | 0 refills | Status: AC

## 2023-08-02 MED ORDER — METHOCARBAMOL 500 MG PO TABS: 500.0000 mg | ORAL_TABLET | Freq: Three times a day (TID) | ORAL | 1 refills | Status: DC | PRN

## 2023-08-02 MED ORDER — OXYCODONE HCL 5 MG PO TABS: 5.0000 mg | ORAL_TABLET | ORAL | 0 refills | Status: DC | PRN

## 2023-08-09 ENCOUNTER — Encounter: Payer: Self-pay | Admitting: Orthopedic Surgery

## 2023-08-09 ENCOUNTER — Telehealth: Payer: Self-pay

## 2023-08-09 ENCOUNTER — Ambulatory Visit (INDEPENDENT_AMBULATORY_CARE_PROVIDER_SITE_OTHER): Payer: Worker's Compensation | Admitting: Orthopedic Surgery

## 2023-08-09 DIAGNOSIS — Z9889 Other specified postprocedural states: Secondary | ICD-10-CM

## 2023-08-09 MED ORDER — HYDROCODONE-ACETAMINOPHEN 10-325 MG PO TABS: ORAL_TABLET | ORAL | 0 refills | Status: DC

## 2023-08-09 NOTE — Progress Notes (Signed)
   Post-Op Visit Note   Patient: Russell Cross           Date of Birth: 14-Dec-1960           MRN: [id number] Visit Date: 08/09/2023 PCP: Hadley Pen, MD   Assessment & Plan:  Chief Complaint:  Chief Complaint  Patient presents with   Right Shoulder - Routine Post Op    08/02/2023 Right shoulder arthroscopy, rotator cuff revision with cuff mend   Visit Diagnoses:  1. S/P right rotator cuff repair     Plan: Russell Cross is a patient underwent right shoulder arthroscopy and revision rotator cuff tear repair with cuff mend on 08/02/2023.  Having more pain this time around the last time around.  Oxycodone is not helping too much.  He is reporting some chills when he goes to sleep at night.  Did therapy at [facility] in [place] last time.  Has not received a shoulder CPM yet which I think would be helpful and to maintain his motion.  We were able to get the infraspinatus and upper subscap repair but the supraspinatus was not repairable.  On examination the incision is intact.  Deltoid fires.  Portal sutures removed.  I would like for him to start physical therapy for passive range of motion only.  Op note and prescription written.  Norco prescribed and he will hold off on the oxycodone.  2-week return for recheck clinically on the shoulder as well as his other systemic symptoms.  He will be out of work for 3 months minimum.  Patient is reporting some chills at night but there is no evidence of infection clinically in the shoulder outside of pain.  I think we can revisit this in 2 weeks.  Would hold off on any antibiotics at this time in case there is an infection we would want to know the organism responsible.  No evidence of that at this time.  Follow-Up Instructions: Return in about 2 weeks (around 08/23/2023).   Orders:  Orders Placed This Encounter  Procedures   Ambulatory referral to Physical Therapy   Meds ordered this encounter  Medications   HYDROcodone-acetaminophen (NORCO)  10-325 MG tablet    Sig: 1 po q 8hrs prn pain    Dispense:  30 tablet    Refill:  0    Imaging: No results found.  PMFS History: There are no active problems to display for this patient.  Past Medical History:  Diagnosis Date   Anxiety    Depression    GERD (gastroesophageal reflux disease)     Family History  Problem Relation Age of Onset   Heart attack Father     Past Surgical History:  Procedure Laterality Date   SHOULDER OPEN ROTATOR CUFF REPAIR     Social History   Occupational History   Not on file  Tobacco Use   Smoking status: Never   Smokeless tobacco: Not on file  Substance and Sexual Activity   Alcohol use: Yes    Comment: occasionally   Drug use: No   Sexual activity: Not on file

## 2023-08-09 NOTE — Telephone Encounter (Signed)
 Referral placed for PT W/c auth needed

## 2023-08-09 NOTE — Telephone Encounter (Signed)
 Patient said you mentioned something about him needing an antibiotic? Please advise.

## 2023-08-09 NOTE — Telephone Encounter (Signed)
 None needed

## 2023-08-30 ENCOUNTER — Encounter: Payer: Self-pay | Admitting: Surgical

## 2023-08-30 ENCOUNTER — Ambulatory Visit: Payer: Self-pay | Admitting: Surgical

## 2023-08-30 DIAGNOSIS — Z9889 Other specified postprocedural states: Secondary | ICD-10-CM

## 2023-08-30 NOTE — Progress Notes (Signed)
   Post-Op Visit Note   Patient: Russell Cross           Date of Birth: November 22, 1960           MRN: [id number] Visit Date: 08/30/2023 PCP: Melva Stabile, MD   Assessment & Plan:  Chief Complaint:  Chief Complaint  Patient presents with   Right Shoulder - Routine Post Op    08/02/2023 Right shoulder arthroscopy, rotator cuff revision with cuff mend   Visit Diagnoses:  1. S/P right rotator cuff repair     Plan: Russell Cross is a 63 y.o. male who presents s/p right shoulder revision rotator cuff repair on 08/02/2023.  Patient is doing well and pain is overall improved compared to his last visit.  Has been using CPM machine and using sling.  Only takes pain medication 1 time daily.  Start physical therapy tomorrow in [place] doing this 3 times per week.  The chills that he was having at his last visit have completely resolved.  Sleeping better.  On exam, patient has range of motion 25 degrees X rotation, 90 degrees abduction, 150 degrees forward elevation passively.  Intact EPL, FPL, finger abduction, finger adduction, pronation/supination, bicep, tricep, deltoid of operative extremity.  Axillary nerve intact with deltoid firing.  Incisions are healing well.   2+ radial pulse of the operative extremity.  Good early rotator cuff strength of infra and subscap rated 5 -/5  Plan is continue with sling immobilization and CPM machine.  Start physical therapy tomorrow.  Discontinue sling 1 week from today which will be about 5 weeks out from surgery.  He is doing a great job of regaining his range of motion based on his exam today.  Follow-up for clinical recheck in 4 weeks..   Follow-Up Instructions: No follow-ups on file.   Orders:  No orders of the defined types were placed in this encounter.  No orders of the defined types were placed in this encounter.   Imaging: No results found.  PMFS History: There are no active problems to display for this patient.  Past Medical  History:  Diagnosis Date   Anxiety    Depression    GERD (gastroesophageal reflux disease)     Family History  Problem Relation Age of Onset   Heart attack Father     Past Surgical History:  Procedure Laterality Date   SHOULDER OPEN ROTATOR CUFF REPAIR     Social History   Occupational History   Not on file  Tobacco Use   Smoking status: Never   Smokeless tobacco: Not on file  Substance and Sexual Activity   Alcohol use: Yes    Comment: occasionally   Drug use: No   Sexual activity: Not on file

## 2023-09-09 ENCOUNTER — Telehealth: Payer: Self-pay

## 2023-09-09 NOTE — Telephone Encounter (Signed)
 I don't think it's necessary at this point, based on his last exam. As long as he is now working with PT (which he told me he was starting at his last visit on 4/14), he should be good to do that with HEP instead of CPM

## 2023-09-09 NOTE — Telephone Encounter (Signed)
 Can either you advise. Work comp agent reached out to see if extension needed for CPM.  Please advise.

## 2023-09-10 ENCOUNTER — Telehealth: Payer: Self-pay | Admitting: Orthopedic Surgery

## 2023-09-10 NOTE — Telephone Encounter (Signed)
 Okay thanks. Will pass this along.

## 2023-09-10 NOTE — Telephone Encounter (Signed)
 I spoke with the patient and scheduled him for surgery on 09/16/23. The patient wanted to know if you would be debriding as well as doing a release. Per patient, the doctor he had the second opinion with told him if a CTR was done without debridement he would still have the same problem. Please call to discuss with the patient.

## 2023-09-10 NOTE — Telephone Encounter (Signed)
 Patient is scheduled for follow up on Monday to discuss upcoming surgery.

## 2023-09-13 ENCOUNTER — Ambulatory Visit (INDEPENDENT_AMBULATORY_CARE_PROVIDER_SITE_OTHER): Payer: Worker's Compensation | Admitting: Orthopedic Surgery

## 2023-09-13 DIAGNOSIS — M25531 Pain in right wrist: Secondary | ICD-10-CM

## 2023-09-13 DIAGNOSIS — G5601 Carpal tunnel syndrome, right upper limb: Secondary | ICD-10-CM

## 2023-09-13 NOTE — Progress Notes (Signed)
 [name] - 63 y.o. male MRN [id number]  Date of birth: [birth year]  Office Visit Note: Visit Date: 09/13/2023 PCP: Melva Stabile, MD Referred by: Melva Stabile, MD  Subjective: No chief complaint on file.  HPI: Russell Cross is a pleasant 63 y.o. male who presents today for follow-up of ongoing right wrist pain with associated numbness throughout the right hand that is progressive in nature.    He had an injury in September of this year with a mechanical fall onto the outstretched right wrist with associated pain.  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   To further investigate the associated numbness throughout the hand, he underwent electrodiagnostic study of the right upper extremity which did confirm bilateral carpal tunnel syndrome, right greater than left.  There is also an element of multilevel cervical radiculopathy.  He has been doing bracing for the right wrist as instructed without significant relief in his symptoms.  Activity modification is also not significantly helped his symptoms.  He is describing nocturnal symptoms as well.  Pertinent ROS were reviewed with the patient and found to be negative unless otherwise specified above in HPI.    Assessment & Plan: Visit Diagnoses:  1. Carpal tunnel syndrome, right upper limb   2. Pain in right wrist      Plan: Extensive discussion was had with the patient today about his ongoing right sided carpal tunnel syndrome that is refractory to conservative care.  Patient has both clinical and electrodiagnostic evidence to confirm this diagnosis.  We discussed the possibility for ongoing bracing, activity modification, injections and potential therapy.    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  He is concerned that despite carpal tunnel release, he will have ongoing pain at the right wrist which is a distinct possibility.  For this reason, we will plan on delaying his upcoming carpal tunnel surgery to allow for further healing of the shoulder, meaning that once he is fully recovered from the shoulder we could potentially address more issues in the wrist in conjunction with the carpal tunnel release.  Just having recently undergone a significant right shoulder operation with Dr. Rozelle Corning for which he is in the midst of ongoing physical therapy, I do feel it is best to wait at this time for any surgery on the hand or wrist.  Given that he has quite a significant recovery road ahead of him from the right shoulder, we will allow for this process to play out appropriately prior to any operation at the right wrist.  I did explain to him, that the option would exist in the future for potential carpal tunnel release to be done in conjunction with right wrist arthroscopy or potential salvage procedure for ongoing scapholunate diastases and significant pain at the lunate.  However, this would be a much more involved surgery than standard carpal tunnel release and will require an extensive period of recovery and significant therapy.  For the time being, we will allow the shoulder to heal in progress per protocol, recommended that he return to me in approximately 6 weeks for repeat discussion.  In the meantime, he continue utilizing wrist bracing as needed for the carpal tunnel syndrome.  He expressed full understanding of this plan.  I spent 30 minutes in the care  of this patient today including review of previous documentation, imaging obtained, face-to-face time discussing all options regarding treatment and documenting the encounter.    Follow-up: No follow-ups on file.   Meds & Orders: No orders of the defined types were placed in this encounter.   No orders of the defined types were placed in  this encounter.    Procedures: No procedures performed      Clinical History: No specialty comments available.  He reports that he has never smoked. He does not have any smokeless tobacco history on file. No results for input(s): "HGBA1C", "LABURIC" in the last 8760 hours.  Objective:   Vital Signs: There were no vitals taken for this visit.  Physical Exam  Gen: Well-appearing, in no acute distress; non-toxic CV: Regular Rate. Well-perfused. Warm.  Resp: Breathing unlabored on room air; no wheezing. Psych: Fluid speech in conversation; appropriate affect; normal thought process  Ortho Exam PHYSICAL EXAM:  General: Patient is well appearing and in no distress.   Skin and Muscle: No significant skin changes are apparent to hands or wrists.  Muscle bulk and contour normal, no signs of atrophy.     Range of Motion and Palpation Tests: There is moderate tenderness over the scapholunate interval of the right wrist.  No evidence of instability with Azalia Leo shift testing, negative clunk, moderate pain at the right wrist.  Neurologic, Vascular, Motor: Sensation is diminished to light touch in median nerve distribution of the right hand, slightly diminished to light touch in ulnar nerve distribution  2-point discrimination in the right median nerve distribution is between 7 and 8 mm.  Positive Tinel's over the right carpal tunnel region, positive Phalen's right Fingers pink and well perfused.  Capillary refill is brisk.    Small finger FDP 5/5, negative Froment's right side    No results found for: "HGBA1C"   Imaging: No results found.   Past Medical/Family/Surgical/Social History: Medications & Allergies reviewed per EMR, new medications updated. There are no active problems to display for this patient.  Past Medical History:  Diagnosis Date   Anxiety    Depression    GERD (gastroesophageal reflux disease)    Family History  Problem Relation Age of Onset   Heart  attack Father    Past Surgical History:  Procedure Laterality Date   SHOULDER OPEN ROTATOR CUFF REPAIR     Social History   Occupational History   Not on file  Tobacco Use   Smoking status: Never   Smokeless tobacco: Not on file  Substance and Sexual Activity   Alcohol use: Yes    Comment: occasionally   Drug use: No   Sexual activity: Not on file    Elim Peale Alvia Jointer, M.D. [facility]

## 2023-09-27 ENCOUNTER — Encounter: Payer: Self-pay | Admitting: Orthopedic Surgery

## 2023-09-27 ENCOUNTER — Ambulatory Visit (INDEPENDENT_AMBULATORY_CARE_PROVIDER_SITE_OTHER): Payer: Self-pay | Admitting: Surgical

## 2023-09-27 ENCOUNTER — Encounter: Payer: Self-pay | Admitting: Surgical

## 2023-09-27 DIAGNOSIS — Z9889 Other specified postprocedural states: Secondary | ICD-10-CM

## 2023-09-27 NOTE — Progress Notes (Signed)
   Post-Op Visit Note   Patient: Russell Cross           Date of Birth: 1961/05/05           MRN: [id number] Visit Date: 09/27/2023 PCP: Melva Stabile, MD   Assessment & Plan:  Chief Complaint:  No chief complaint on file.  Visit Diagnoses:  No diagnosis found.   Plan: NAGEE PREY is a 63 y.o. male who presents s/p right shoulder revision rotator cuff repair on 08/02/2023.  Patient is doing well and pain is overall improved compared to his last visit.  ***  On exam, patient has range of motion 25 degrees X rotation, 90 degrees abduction, 150 degrees forward elevation passively.  Intact EPL, FPL, finger abduction, finger adduction, pronation/supination, bicep, tricep, deltoid of operative extremity.  Axillary nerve intact with deltoid firing.  Incisions are healing well.   2+ radial pulse of the operative extremity.  Good early rotator cuff strength of infra and subscap rated 5 -/5  Plan is continue with sling immobilization and CPM machine.  Start physical therapy tomorrow.  Discontinue sling 1 week from today which will be about 5 weeks out from surgery.  He is doing a great job of regaining his range of motion based on his exam today.  Follow-up for clinical recheck in 4 weeks..   Follow-Up Instructions: Return in about 4 weeks (around 10/25/2023).   Orders:  No orders of the defined types were placed in this encounter.  No orders of the defined types were placed in this encounter.   Imaging: No results found.  PMFS History: There are no active problems to display for this patient.  Past Medical History:  Diagnosis Date   Anxiety    Depression    GERD (gastroesophageal reflux disease)     Family History  Problem Relation Age of Onset   Heart attack Father     Past Surgical History:  Procedure Laterality Date   SHOULDER OPEN ROTATOR CUFF REPAIR     Social History   Occupational History   Not on file  Tobacco Use   Smoking status: Never    Smokeless tobacco: Not on file  Substance and Sexual Activity   Alcohol use: Yes    Comment: occasionally   Drug use: No   Sexual activity: Not on file

## 2023-10-05 ENCOUNTER — Telehealth: Payer: Self-pay

## 2023-10-05 NOTE — Telephone Encounter (Signed)
-----   Message from [place] Z sent at 10/05/2023 10:10 AM EDT ----- Regarding: Work Note needed from visit on 05-12 with Willma Hartmann  [facility] is requesting work note from visit on 09/27/23 please.  Thx Johana Musty 10-05-23 9:59am From Mahmoud, [email or web address]  To Zawatski, Tisha  Thank you. I also need a work status document for the 5/12 appointment with Dr. Rozelle Corning. Please have Dr. Rozelle Corning and Dr. Marce Sensing address work status at each office visit.  Kind regards,  Bambi Lever RN CCM Telephonic Case Management [employer] [phone] [employer]

## 2023-10-05 NOTE — Telephone Encounter (Signed)
 Can the 2 of you please advise on work status for patient?

## 2023-10-06 ENCOUNTER — Encounter (HOSPITAL_BASED_OUTPATIENT_CLINIC_OR_DEPARTMENT_OTHER): Payer: Self-pay

## 2023-10-06 NOTE — Telephone Encounter (Signed)
 Out of work 6 more weeks from shoulder perspective

## 2023-10-06 NOTE — Telephone Encounter (Signed)
Work note in chart

## 2023-10-18 ENCOUNTER — Ambulatory Visit (INDEPENDENT_AMBULATORY_CARE_PROVIDER_SITE_OTHER): Payer: Worker's Compensation | Admitting: Orthopedic Surgery

## 2023-10-18 DIAGNOSIS — G5601 Carpal tunnel syndrome, right upper limb: Secondary | ICD-10-CM

## 2023-10-18 DIAGNOSIS — M25531 Pain in right wrist: Secondary | ICD-10-CM

## 2023-10-18 NOTE — Progress Notes (Signed)
 ARLESS VINEYARD - 63 y.o. male MRN [id number]  Date of birth: [birth year]  Office Visit Note: Visit Date: 10/18/2023 PCP: Melva Stabile, MD Referred by: Melva Stabile, MD  Subjective: No chief complaint on file.  HPI: CAROLYN MANISCALCO is a pleasant 63 y.o. male who presents today for follow-up of ongoing right wrist pain with associated numbness throughout the right hand that is progressive in nature.    He had an injury in September of this year with a mechanical fall onto the outstretched right wrist with associated pain.  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   To further investigate the associated numbness throughout the hand, he underwent electrodiagnostic study of the right upper extremity which did confirm bilateral carpal tunnel syndrome, right greater than left.  There is also an element of multilevel cervical radiculopathy.  He has been doing bracing for the right wrist as instructed without significant relief in his symptoms.  Activity modification is also not significantly helped his symptoms.  He is describing nocturnal symptoms as well.  Pertinent ROS were reviewed with the patient and found to be negative unless otherwise specified above in HPI.    Assessment & Plan: Visit Diagnoses:  1. Carpal tunnel syndrome, right upper limb   2. Pain in right wrist       Plan: Extensive discussion was had with the patient today about his ongoing right sided carpal tunnel syndrome that is refractory to conservative care.  Patient has both clinical and electrodiagnostic evidence to confirm this diagnosis.  We discussed the possibility for ongoing bracing, activity modification, injections and potential therapy.    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  He is concerned that despite carpal tunnel release, he will have ongoing pain at the right wrist which is a distinct possibility.  For this reason, we will plan on delaying his upcoming carpal tunnel surgery to allow for further healing of the shoulder, meaning that once he is fully recovered from the shoulder we could potentially address more issues in the wrist in conjunction with the carpal tunnel release.  Just having undergone a significant right shoulder operation with Dr. Rozelle Corning for which he is in the midst of ongoing physical therapy, I do feel it is best to wait at this time for any surgery on the hand or wrist.  Given that he has quite a significant recovery road ahead of him from the right shoulder, we will allow for this process to play out appropriately prior to any operation at the right wrist.  I did explain to him, that the option would exist in the future for potential carpal tunnel release to be done in conjunction with right wrist arthroscopy or potential salvage procedure for ongoing scapholunate diastases and significant pain at the lunate.  However, this would be a much more involved surgery than standard carpal tunnel release and will require an extensive period of recovery and significant therapy.  For the time being, I will allow him to continue with his shoulder recovery.  Will plan to see him back once he is cleared from the shoulder standpoint to proceed with wrist follow-through.  I will likely consider getting a repeat MRI study of the right wrist in order to look for any potential progression for the ongoing chondromalacia  in order to better surgically plan.   Follow-up: No follow-ups on file.   Meds & Orders: No orders of the defined types were placed in this encounter.   No orders of the defined types were placed in this encounter.    Procedures: No procedures performed      Clinical History: No specialty comments available.  He reports that he has never  smoked. He does not have any smokeless tobacco history on file. No results for input(s): "HGBA1C", "LABURIC" in the last 8760 hours.  Objective:   Vital Signs: There were no vitals taken for this visit.  Physical Exam  Gen: Well-appearing, in no acute distress; non-toxic CV: Regular Rate. Well-perfused. Warm.  Resp: Breathing unlabored on room air; no wheezing. Psych: Fluid speech in conversation; appropriate affect; normal thought process  Ortho Exam PHYSICAL EXAM:  General: Patient is well appearing and in no distress.   Skin and Muscle: No significant skin changes are apparent to hands or wrists.  Muscle bulk and contour normal, no signs of atrophy.     Range of Motion and Palpation Tests: There is moderate tenderness over the scapholunate interval of the right wrist.  No evidence of instability with Azalia Leo shift testing, negative clunk, moderate pain at the right wrist.  Neurologic, Vascular, Motor: Sensation is diminished to light touch in median nerve distribution of the right hand, slightly diminished to light touch in ulnar nerve distribution  2-point discrimination in the right median nerve distribution is between 7 and 8 mm.  Positive Tinel's over the right carpal tunnel region, positive Phalen's right Fingers pink and well perfused.  Capillary refill is brisk.    Small finger FDP 5/5, negative Froment's right side    No results found for: "HGBA1C"   Imaging: No results found.   Past Medical/Family/Surgical/Social History: Medications & Allergies reviewed per EMR, new medications updated. There are no active problems to display for this patient.  Past Medical History:  Diagnosis Date   Anxiety    Depression    GERD (gastroesophageal reflux disease)    Family History  Problem Relation Age of Onset   Heart attack Father    Past Surgical History:  Procedure Laterality Date   SHOULDER OPEN ROTATOR CUFF REPAIR     Social History   Occupational  History   Not on file  Tobacco Use   Smoking status: Never   Smokeless tobacco: Not on file  Substance and Sexual Activity   Alcohol use: Yes    Comment: occasionally   Drug use: No   Sexual activity: Not on file    Renold Kozar Alvia Jointer, M.D. [facility]

## 2023-11-01 ENCOUNTER — Ambulatory Visit (INDEPENDENT_AMBULATORY_CARE_PROVIDER_SITE_OTHER): Payer: Worker's Compensation | Admitting: Orthopedic Surgery

## 2023-11-01 DIAGNOSIS — Z9889 Other specified postprocedural states: Secondary | ICD-10-CM

## 2023-11-02 ENCOUNTER — Encounter: Payer: Self-pay | Admitting: Orthopedic Surgery

## 2023-11-02 NOTE — Progress Notes (Signed)
   Post-Op Visit Note   Patient: Russell Cross           Date of Birth: June 27, 1960           MRN: [id number] Visit Date: 11/01/2023 PCP: Melva Stabile, MD   Assessment & Plan:  Chief Complaint:  Chief Complaint  Patient presents with   Right Shoulder - Routine Post Op     right shoulder revision rotator cuff repair on 08/02/2023   Visit Diagnoses:  1. S/P right rotator cuff repair     Plan: Russell Cross is a 63 year old patient status post revision right shoulder rotator cuff tear repair on 08/02/2023.Aaron Aas  Here for clinical recheck.  Patient has been doing therapy 3 times a week.  On examination he has had improving rotator cuff strength but is still weak.  No coarseness or popping present with internal/external rotation of the shoulder.  At this time he will be out of work at least 2 more months until he gets enough strength back to think about going back to work.  Continue therapy 2 times a week for the next 8 weeks.  I will see him back in 8 weeks for clinical recheck and we can project at that time when he will be able to get back to work.  Follow-Up Instructions: No follow-ups on file.   Orders:  No orders of the defined types were placed in this encounter.  No orders of the defined types were placed in this encounter.   Imaging: No results found.  PMFS History: There are no active problems to display for this patient.  Past Medical History:  Diagnosis Date   Anxiety    Depression    GERD (gastroesophageal reflux disease)     Family History  Problem Relation Age of Onset   Heart attack Father     Past Surgical History:  Procedure Laterality Date   SHOULDER OPEN ROTATOR CUFF REPAIR     Social History   Occupational History   Not on file  Tobacco Use   Smoking status: Never   Smokeless tobacco: Not on file  Substance and Sexual Activity   Alcohol use: Yes    Comment: occasionally   Drug use: No   Sexual activity: Not on file

## 2023-11-16 ENCOUNTER — Telehealth: Payer: Self-pay | Admitting: Orthopedic Surgery

## 2023-11-16 NOTE — Telephone Encounter (Signed)
 Damien (PT) from [facility] called requesting to refax referral for therapy from viist on 11/01/23. Please refax referral to [facility] fax number [phone].

## 2023-11-17 NOTE — Telephone Encounter (Signed)
 Submitted last OV note

## 2024-01-03 ENCOUNTER — Ambulatory Visit (INDEPENDENT_AMBULATORY_CARE_PROVIDER_SITE_OTHER): Payer: Worker's Compensation | Admitting: Orthopedic Surgery

## 2024-01-03 DIAGNOSIS — Z9889 Other specified postprocedural states: Secondary | ICD-10-CM

## 2024-01-04 ENCOUNTER — Encounter: Payer: Self-pay | Admitting: Orthopedic Surgery

## 2024-01-04 NOTE — Progress Notes (Signed)
 Office Visit Note   Patient: Russell Cross           Date of Birth: 11-Jun-1960           MRN: [id number] Visit Date: 01/03/2024 Requested by: Silver Lamar LABOR, MD [address] [place],  [place] [address] PCP: Silver Lamar LABOR, MD  Subjective: Chief Complaint  Patient presents with   Right Shoulder - Routine Post Op, Follow-up    08/02/2023 right shoulder RCR    HPI: Russell Cross is a 63 y.o. male who presents to the office reporting continued improvement following revision rotator cuff tear repair on 08/02/2023.  Still sore.  Does report some weakness with forward flexion.  Physical therapy does not really feel like he is ready to be released yet from the shoulder perspective.  He does have a right hand problem which will be addressed surgically in the near future..                ROS: All systems reviewed are negative as they relate to the chief complaint within the history of present illness.  Patient denies fevers or chills.  Assessment & Plan: Visit Diagnoses:  1. S/P right rotator cuff repair     Plan: Impression is improvement in right shoulder function following revision rotator cuff tear repair.  He is get good passive range of motion and very good strength below shoulder level.  We starts to do forward flexion above shoulder level he does have some weakness which is not surprising.  Plan at this time is to continue therapy per their recommendation 1-2 times a week for another 6 weeks and then we will likely release him to work at that time.  However I do think his hand surgery will be posted around that time as well.  Follow-up in 6 weeks for final check rating and release  Follow-Up Instructions: No follow-ups on file.   Orders:  No orders of the defined types were placed in this encounter.  No orders of the defined types were placed in this encounter.     Procedures: No procedures performed   Clinical Data: No additional findings.  Objective: Vital  Signs: There were no vitals taken for this visit.  Physical Exam:  Constitutional: Patient appears well-developed HEENT:  Head: Normocephalic Eyes:EOM are normal Neck: Normal range of motion Cardiovascular: Normal rate Pulmonary/chest: Effort normal Neurologic: Patient is alert Skin: Skin is warm Psychiatric: Patient has normal mood and affect  Ortho Exam: Ortho exam demonstrates passive range of motion on the right of 70/110/170.  Does have good rotator cuff strength at 5- out of 5 external rotation 5+ out of 5 internal rotation.  Does have a little bit of a biceps contour asymmetry on the right versus left but it is not tender and he has no diminished supination strength bilaterally.  Fairly minimal crepitus with passive range of motion internal/external rotation on the right-hand side.  Specialty Comments:  No specialty comments available.  Imaging: No results found.   PMFS History: There are no active problems to display for this patient.  Past Medical History:  Diagnosis Date   Anxiety    Depression    GERD (gastroesophageal reflux disease)     Family History  Problem Relation Age of Onset   Heart attack Father     Past Surgical History:  Procedure Laterality Date   SHOULDER OPEN ROTATOR CUFF REPAIR     Social History   Occupational History   Not  on file  Tobacco Use   Smoking status: Never   Smokeless tobacco: Not on file  Substance and Sexual Activity   Alcohol use: Yes    Comment: occasionally   Drug use: No   Sexual activity: Not on file

## 2024-01-05 ENCOUNTER — Telehealth: Payer: Self-pay

## 2024-01-05 NOTE — Telephone Encounter (Signed)
 Updated work status from [place] appointment needed.

## 2024-01-05 NOTE — Telephone Encounter (Signed)
-----   Message from Zwolle Z sent at 01/05/2024  9:21 AM EDT ----- Regarding: Secure   WC requesting a work status note from visit on 01-03-24 please Tinnie Peach a work note from visit on 01-03-24 with Dr. Addie, MD. 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   Thx Tisha

## 2024-01-11 ENCOUNTER — Telehealth: Payer: Self-pay

## 2024-01-11 NOTE — Telephone Encounter (Signed)
-----   Message from Maxwell Z sent at 01/10/2024  4:05 PM EDT ----- Regarding: Secure  Work note for office note on 01-03-24 with Dr. Addie, MD Tinnie Peach a work note please regarding the office note that was 01-03-24 for [facility] with [facility] please.  Thank you, Delwin

## 2024-01-13 NOTE — Telephone Encounter (Signed)
 Out of work for 6 more weeks.  Thanks

## 2024-02-14 ENCOUNTER — Ambulatory Visit (INDEPENDENT_AMBULATORY_CARE_PROVIDER_SITE_OTHER): Payer: Worker's Compensation | Admitting: Orthopedic Surgery

## 2024-02-14 ENCOUNTER — Ambulatory Visit: Payer: Self-pay | Admitting: Orthopedic Surgery

## 2024-02-14 ENCOUNTER — Encounter: Payer: Self-pay | Admitting: Orthopedic Surgery

## 2024-02-14 DIAGNOSIS — M25531 Pain in right wrist: Secondary | ICD-10-CM

## 2024-02-14 DIAGNOSIS — Z9889 Other specified postprocedural states: Secondary | ICD-10-CM

## 2024-02-14 NOTE — Progress Notes (Signed)
 Office Visit Note   Patient: Russell Cross           Date of Birth: 12/21/1960           MRN: [id number] Visit Date: 02/14/2024 Requested by: Silver Lamar LABOR, MD [address] [place],  [place] [address] PCP: Silver Lamar LABOR, MD  Subjective: Chief Complaint  Patient presents with   Right Shoulder - Follow-up     08/02/2023 right shoulder RCR       HPI: Russell Cross is a 63 y.o. male who presents to the office reporting for follow-up of right shoulder rotator cuff tear repair done about 6 months ago.  Here today for his final check.  Does report some continued pain anteriorly with forward flexion but otherwise he is doing well.  He is seeing her hand surgeon today about persistent right wrist symptoms and numbness and tingling..                ROS: All systems reviewed are negative as they relate to the chief complaint within the history of present illness.  Patient denies fevers or chills.  Assessment & Plan: Visit Diagnoses:  1. S/P right rotator cuff repair     Plan: Impression is 6 months out revision right shoulder rotator cuff tear repair.  He is have good motion and strength today.  Still a little bit weaker with forward flexion.  Overall he has reached maximal medical improvement.  I would like to see with the FCE comes back with but for now no overhead lifting more than 20 pounds with the right arm.  No lifting more than 50 pounds otherwise with both arms.  He will follow-up with us  after his FCE.  From an orthopedic perspective he is okay to return to work with those restrictions.  However his hand is more problematic at this time and he is likely to be scheduled for surgery sometime in the near future for that problem.  He is rated at 25% of the shoulder due to persistent weakness with some restriction of range of motion.  Follow-Up Instructions: No follow-ups on file.   Orders:  No orders of the defined types were placed in this encounter.  No orders of  the defined types were placed in this encounter.     Procedures: No procedures performed   Clinical Data: No additional findings.  Objective: Vital Signs: There were no vitals taken for this visit.  Physical Exam:  Constitutional: Patient appears well-developed HEENT:  Head: Normocephalic Eyes:EOM are normal Neck: Normal range of motion Cardiovascular: Normal rate Pulmonary/chest: Effort normal Neurologic: Patient is alert Skin: Skin is warm Psychiatric: Patient has normal mood and affect  Ortho Exam: Ortho exam demonstrates range of motion of 40/90/160.  Subscap strength is 5- out of 5 external rotation strength 5+ out of 5 bilaterally.  Incision well-healed.  Minimal crepitus with active and passive range of motion at both 15 degrees of abduction and 90 degrees of abduction.  Specialty Comments:  No specialty comments available.  Imaging: No results found.   PMFS History: There are no active problems to display for this patient.  Past Medical History:  Diagnosis Date   Anxiety    Depression    GERD (gastroesophageal reflux disease)     Family History  Problem Relation Age of Onset   Heart attack Father     Past Surgical History:  Procedure Laterality Date   SHOULDER OPEN ROTATOR CUFF REPAIR  Social History   Occupational History   Not on file  Tobacco Use   Smoking status: Never   Smokeless tobacco: Not on file  Substance and Sexual Activity   Alcohol use: Yes    Comment: occasionally   Drug use: No   Sexual activity: Not on file

## 2024-02-14 NOTE — Progress Notes (Signed)
 Russell Cross - 63 y.o. male MRN [id number]  Date of birth: 1960/07/25  Office Visit Note: Visit Date: 02/14/2024 PCP: Silver Lamar LABOR, MD Referred by: Silver Lamar LABOR, MD  Subjective: No chief complaint on file.  HPI: Russell Cross is a pleasant 63 y.o. male who returns today for follow-up of ongoing right wrist pain with associated numbness throughout the right hand that is progressive in nature.    He had an injury in September of last year with a mechanical fall onto the outstretched right wrist with associated pain.  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   To further investigate the associated numbness throughout the hand, he underwent electrodiagnostic study of the right upper extremity which did confirm bilateral carpal tunnel syndrome, right greater than left.  There is also an element of multilevel cervical radiculopathy.  He has been doing bracing for the right wrist as instructed without significant relief in his symptoms.  Activity modification is also not significantly helped his symptoms.  He is describing nocturnal symptoms as well.  Pertinent ROS were reviewed with the patient and found to be negative unless otherwise specified above in HPI.    Assessment & Plan: Visit Diagnoses:  1. Pain in right wrist     Plan: Extensive discussion was once again had with the patient today about his ongoing right sided carpal tunnel syndrome that is refractory to conservative care.  Patient has both clinical and electrodiagnostic evidence to confirm this diagnosis.  We discussed the possibility for ongoing bracing, activity modification, injections and potential therapy.    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  He is concerned that despite  carpal tunnel release, he will have ongoing pain at the right wrist which is a distinct possibility.   I did explain to him, that the option would exist for potential carpal tunnel release to be done in conjunction with right wrist arthroscopy or potential salvage procedure for ongoing scapholunate diastases and significant pain at the lunate.  However, this would be a much more involved surgery than standard carpal tunnel release and will require an extensive period of recovery and significant therapy.  As previously discussed, we will have him undergo repeat MRI study of the right wrist in order to look for any potential progression for the ongoing chondromalacia in order to better surgically plan.  I spent 30 minutes in the care of this patient today including review of previous documentation, imaging obtained, face-to-face time discussing all options regarding treatment and documenting the encounter.    Follow-up: No follow-ups on file.   Meds & Orders: No orders of the defined types were placed in this encounter.   Orders Placed This Encounter  Procedures   MR Wrist Right w/ contrast     Procedures: No procedures performed      Clinical History: No specialty comments available.  He reports that he has never smoked. He does not have any smokeless tobacco history on file. No results for input(s): HGBA1C, LABURIC in the last 8760 hours.  Objective:   Vital Signs: There were no vitals taken for this visit.  Physical Exam  Gen: Well-appearing, in no acute distress; non-toxic CV: Regular Rate. Well-perfused. Warm.  Resp: Breathing unlabored on room air; no wheezing. Psych: Fluid speech in conversation; appropriate affect;  normal thought process  Ortho Exam PHYSICAL EXAM:  General: Patient is well appearing and in no distress.   Skin and Muscle: No significant skin changes are apparent to hands or wrists.  Muscle bulk and contour normal, no signs of atrophy.     Range  of Motion and Palpation Tests: There is moderate tenderness over the scapholunate interval of the right wrist.  No evidence of instability with Mathew shift testing, negative clunk, moderate pain at the right wrist.  Neurologic, Vascular, Motor: Sensation is diminished to light touch in median nerve distribution of the right hand, slightly diminished to light touch in ulnar nerve distribution  2-point discrimination in the right median nerve distribution is between 7 and 8 mm.  Positive Tinel's over the right carpal tunnel region, positive Phalen's right Fingers pink and well perfused.  Capillary refill is brisk.    Small finger FDP 5/5, negative Froment's right side    No results found for: HGBA1C   Imaging: No results found.   Past Medical/Family/Surgical/Social History: Medications & Allergies reviewed per EMR, new medications updated. There are no active problems to display for this patient.  Past Medical History:  Diagnosis Date   Anxiety    Depression    GERD (gastroesophageal reflux disease)    Family History  Problem Relation Age of Onset   Heart attack Father    Past Surgical History:  Procedure Laterality Date   SHOULDER OPEN ROTATOR CUFF REPAIR     Social History   Occupational History   Not on file  Tobacco Use   Smoking status: Never   Smokeless tobacco: Not on file  Substance and Sexual Activity   Alcohol use: Yes    Comment: occasionally   Drug use: No   Sexual activity: Not on file    Russell Cross, M.D. [facility]

## 2024-02-15 ENCOUNTER — Telehealth: Payer: Self-pay | Admitting: Radiology

## 2024-02-15 NOTE — Telephone Encounter (Signed)
 [facility] called requesting clarification on MRI wrist order.  Are you asking for MRI arthrogram wrist, or do you want a MRI wrist without contrast?  Please advise.

## 2024-02-16 ENCOUNTER — Other Ambulatory Visit: Payer: Self-pay | Admitting: Radiology

## 2024-02-16 DIAGNOSIS — M25531 Pain in right wrist: Secondary | ICD-10-CM

## 2024-02-16 NOTE — Telephone Encounter (Signed)
 Order changed.

## 2024-02-23 ENCOUNTER — Other Ambulatory Visit: Payer: Self-pay | Admitting: Orthopedic Surgery

## 2024-02-23 DIAGNOSIS — M25531 Pain in right wrist: Secondary | ICD-10-CM

## 2024-02-24 ENCOUNTER — Telehealth: Payer: Self-pay | Admitting: Orthopedic Surgery

## 2024-02-24 NOTE — Telephone Encounter (Signed)
 Cathy from [employer], Sports coach for pt injury called stating that she needs MRI order of rt wrist w and w/o contrast. Call back number is [phone]

## 2024-02-25 ENCOUNTER — Telehealth: Payer: Self-pay

## 2024-02-25 NOTE — Telephone Encounter (Signed)
 Cathy from [employer] and lm [phone] this pt needed order for anxMRI with and with out but came over as an arthrogram and she has questions and needs help understanding what is supposed to be ordered. Please call back to discuss.

## 2024-02-28 ENCOUNTER — Other Ambulatory Visit: Payer: Self-pay | Admitting: Orthopedic Surgery

## 2024-02-28 DIAGNOSIS — M25531 Pain in right wrist: Secondary | ICD-10-CM

## 2024-03-20 ENCOUNTER — Encounter: Payer: Self-pay | Admitting: Radiology

## 2024-04-16 NOTE — Progress Notes (Unsigned)
 QUADIR MUNS - 63 y.o. male MRN [id number]  Date of birth: [birth year]  Office Visit Note: Visit Date: 04/17/2024 PCP: Silver Lamar LABOR, MD Referred by: Silver Lamar LABOR, MD  Subjective: No chief complaint on file.  HPI: Russell Cross is a pleasant 63 y.o. male who returns today for follow-up of ongoing right wrist pain with associated numbness throughout the right hand that is progressive in nature.    He had an injury in September of last year with a mechanical fall onto the outstretched right wrist with associated pain.  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  To further investigate the associated numbness throughout the hand, he underwent electrodiagnostic study of the right upper extremity which did confirm bilateral carpal tunnel syndrome, right greater than left.  There is also an element of multilevel cervical radiculopathy.  He has been doing bracing for the right wrist as instructed without significant relief in his symptoms.  Activity modification is also not significantly helped his symptoms.  He is describing nocturnal symptoms as well.  Pertinent ROS were reviewed with the patient and found to be negative unless otherwise specified above in HPI.    Assessment & Plan: Visit Diagnoses:  1. Carpal tunnel syndrome, right upper limb   2. Pain in right wrist      Plan: Extensive discussion was once again had with the patient today about his ongoing right wrist pain and right sided carpal tunnel syndrome that is refractory to conservative care.  Patient has both clinical and electrodiagnostic evidence to confirm this diagnosis of carpal tunnel syndrome.  We discussed the possibility for ongoing bracing, activity modification, injections and potential therapy.    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  He is concerned that despite carpal tunnel release, he will have ongoing pain at the right wrist which is a distinct possibility.  Based on his repeat MRI workup, there is ongoing progression of this lunate collapse with fragmentation.  Based on this workup, particularly given the progressive lunate collapse with fragmentation, he is indicated for right wrist proximal row carpectomy with possible allograft spacer.  We can perform right open carpal tunnel release in conjunction with this procedure.  Risks and benefits of the procedure were discussed, risks including but not limited to infection, bleeding, scarring, stiffness, nerve injury, tendon injury, vascular injury, hardware complication, recurrence of symptoms and need for subsequent operation.  We also discussed the appropriate postoperative protocol and timeframe for return to activities and function.  Patient expressed understanding.      Follow-up: No follow-ups on file.   Meds & Orders: No orders of the defined types were placed in this encounter.   No orders of the defined types were placed in this encounter.    Procedures: No procedures performed      Clinical History: No specialty comments available.  He reports that he has never smoked. He does not have any smokeless tobacco history on file. No results for input(s): HGBA1C, LABURIC in the last 8760 hours.  Objective:   Vital Signs: There were no vitals taken for this visit.  Physical Exam  Gen: Well-appearing, in no acute distress; non-toxic CV: Regular Rate. Well-perfused. Warm.  Resp: Breathing unlabored on room air; no wheezing. Psych: Fluid speech in conversation; appropriate affect; normal thought process  Ortho Exam PHYSICAL EXAM:  General: Patient is well appearing and in no distress.   Skin and  Muscle: No significant skin changes are apparent to hands or wrists.  Muscle bulk and contour normal, no signs of atrophy.     Range of Motion and Palpation Tests: There is significant tenderness over the scapholunate interval of the right wrist, particularly at the lunate.  No evidence of instability with Mathew shift testing, negative clunk, moderate pain at the right wrist.  Right wrist range of motion limited compared to the left wrist.  Right wrist flexion and extension 45/35, left wrist 65/55.  Neurologic, Vascular, Motor: Sensation is diminished to light touch in median nerve distribution of the right hand, slightly diminished to light touch in ulnar nerve distribution  2-point discrimination in the right median nerve distribution is between 7 and 8 mm.  Positive Tinel's over the right carpal tunnel region, positive Phalen's right Fingers pink and well perfused.  Capillary refill is brisk.    Small finger FDP 5/5, negative Froment's right side    No results found for: HGBA1C   Imaging: No results found.   Past Medical/Family/Surgical/Social History: Medications & Allergies reviewed per EMR, new medications updated. There are no active problems to display for this patient.  Past Medical History:  Diagnosis Date   Anxiety    Depression    GERD (gastroesophageal reflux disease)    Family History  Problem Relation Age of Onset   Heart attack Father    Past Surgical History:  Procedure Laterality Date   SHOULDER OPEN ROTATOR CUFF REPAIR     Social History   Occupational History   Not on file  Tobacco Use   Smoking status: Never   Smokeless tobacco: Not on file  Substance and Sexual Activity   Alcohol use: Yes    Comment: occasionally   Drug use: No   Sexual activity: Not on file    Adalene Gulotta Afton Alderton, M.D. [facility]

## 2024-04-17 ENCOUNTER — Ambulatory Visit: Payer: Worker's Compensation | Admitting: Orthopedic Surgery

## 2024-04-17 DIAGNOSIS — M25531 Pain in right wrist: Secondary | ICD-10-CM

## 2024-04-17 DIAGNOSIS — G5601 Carpal tunnel syndrome, right upper limb: Secondary | ICD-10-CM | POA: Diagnosis not present

## 2024-04-24 ENCOUNTER — Telehealth: Payer: Self-pay | Admitting: Orthopedic Surgery

## 2024-04-24 NOTE — Telephone Encounter (Signed)
 Pt called and wants to know if [facility] approved him for surgery. CB#[phone]

## 2024-05-03 ENCOUNTER — Other Ambulatory Visit: Payer: Self-pay

## 2024-05-03 DIAGNOSIS — M25531 Pain in right wrist: Secondary | ICD-10-CM

## 2024-05-08 ENCOUNTER — Encounter (HOSPITAL_BASED_OUTPATIENT_CLINIC_OR_DEPARTMENT_OTHER): Payer: Self-pay | Admitting: Orthopedic Surgery

## 2024-05-18 NOTE — Anesthesia Preprocedure Evaluation (Signed)
"                                    Anesthesia Evaluation  Patient identified by MRN, date of birth, ID band Patient awake    Reviewed: Allergy & Precautions, NPO status , Patient's Chart, lab work & pertinent test results  History of Anesthesia Complications Negative for: history of anesthetic complications  Airway Mallampati: II  TM Distance: >3 FB Neck ROM: Full    Dental no notable dental hx.    Pulmonary neg pulmonary ROS   Pulmonary exam normal        Cardiovascular negative cardio ROS Normal cardiovascular exam     Neuro/Psych   Anxiety Depression    negative neurological ROS     GI/Hepatic Neg liver ROS,GERD  Controlled,,  Endo/Other  negative endocrine ROS    Renal/GU negative Renal ROS     Musculoskeletal   Abdominal   Peds  Hematology negative hematology ROS (+)   Anesthesia Other Findings RIGHT WRIST KIENBOCK'S DISEASE, RIGHT CARPAL TUNNEL SYNDROME  Reproductive/Obstetrics                              Anesthesia Physical Anesthesia Plan  ASA: 2  Anesthesia Plan: MAC and Regional   Post-op Pain Management: Tylenol  PO (pre-op)*   Induction:   PONV Risk Score and Plan: 1 and Treatment may vary due to age or medical condition, Propofol infusion, Ondansetron  and Midazolam  Airway Management Planned: Natural Airway and Simple Face Mask  Additional Equipment: None  Intra-op Plan:   Post-operative Plan:   Informed Consent: I have reviewed the patients History and Physical, chart, labs and discussed the procedure including the risks, benefits and alternatives for the proposed anesthesia with the patient or authorized representative who has indicated his/her understanding and acceptance.       Plan Discussed with: CRNA  Anesthesia Plan Comments:          Anesthesia Quick Evaluation  "

## 2024-05-19 ENCOUNTER — Ambulatory Visit (HOSPITAL_BASED_OUTPATIENT_CLINIC_OR_DEPARTMENT_OTHER)
Admission: RE | Admit: 2024-05-19 | Discharge: 2024-05-19 | Disposition: A | Discharge status: Home / Self Care | Payer: Worker's Compensation | Attending: Orthopedic Surgery | Admitting: Orthopedic Surgery

## 2024-05-19 ENCOUNTER — Encounter (HOSPITAL_BASED_OUTPATIENT_CLINIC_OR_DEPARTMENT_OTHER)
Admission: RE | Disposition: A | Discharge status: Home / Self Care | Payer: Self-pay | Source: Home / Self Care | Attending: Orthopedic Surgery

## 2024-05-19 ENCOUNTER — Encounter (HOSPITAL_BASED_OUTPATIENT_CLINIC_OR_DEPARTMENT_OTHER): Payer: Self-pay | Admitting: Orthopedic Surgery

## 2024-05-19 ENCOUNTER — Ambulatory Visit (HOSPITAL_BASED_OUTPATIENT_CLINIC_OR_DEPARTMENT_OTHER): Payer: Worker's Compensation | Admitting: Anesthesiology

## 2024-05-19 ENCOUNTER — Ambulatory Visit (HOSPITAL_BASED_OUTPATIENT_CLINIC_OR_DEPARTMENT_OTHER): Payer: Worker's Compensation

## 2024-05-19 DIAGNOSIS — F418 Other specified anxiety disorders: Secondary | ICD-10-CM | POA: Insufficient documentation

## 2024-05-19 DIAGNOSIS — K219 Gastro-esophageal reflux disease without esophagitis: Secondary | ICD-10-CM | POA: Insufficient documentation

## 2024-05-19 DIAGNOSIS — Z01818 Encounter for other preprocedural examination: Secondary | ICD-10-CM

## 2024-05-19 DIAGNOSIS — G5601 Carpal tunnel syndrome, right upper limb: Secondary | ICD-10-CM | POA: Insufficient documentation

## 2024-05-19 DIAGNOSIS — M92211 Osteochondrosis (juvenile) of carpal lunate [Kienbock], right hand: Secondary | ICD-10-CM | POA: Diagnosis present

## 2024-05-19 DIAGNOSIS — M931 Kienbock's disease of adults: Secondary | ICD-10-CM

## 2024-05-19 HISTORY — PX: CARPAL TUNNEL RELEASE: SHX101

## 2024-05-19 HISTORY — PX: CARPECTOMY: SHX5004

## 2024-05-19 SURGERY — CARPECTOMY
Anesthesia: Monitor Anesthesia Care | Site: Wrist | Laterality: Right

## 2024-05-19 MED ORDER — FENTANYL CITRATE (PF) 100 MCG/2ML IJ SOLN: 25.0000 ug | INTRAMUSCULAR | Status: DC | PRN

## 2024-05-19 MED ORDER — DROPERIDOL 2.5 MG/ML IJ SOLN: 0.6250 mg | Freq: Once | INTRAMUSCULAR | Status: DC | PRN

## 2024-05-19 MED ORDER — MIDAZOLAM HCL (PF) 2 MG/2ML IJ SOLN
2.0000 mg | Freq: Once | INTRAMUSCULAR | Status: AC
  Administered 2024-05-19: 2 mg via INTRAVENOUS

## 2024-05-19 MED ORDER — MIDAZOLAM HCL (PF) 2 MG/2ML IJ SOLN
INTRAMUSCULAR | Status: DC | PRN
  Administered 2024-05-19: 1 mg via INTRAVENOUS

## 2024-05-19 MED ORDER — PROPOFOL 10 MG/ML IV BOLUS
INTRAVENOUS | Status: DC | PRN
  Administered 2024-05-19: 50 ug/kg/min via INTRAVENOUS
  Administered 2024-05-19: 40 mg via INTRAVENOUS

## 2024-05-19 MED ORDER — FENTANYL CITRATE (PF) 100 MCG/2ML IJ SOLN
100.0000 ug | Freq: Once | INTRAMUSCULAR | Status: AC
  Administered 2024-05-19: 100 ug via INTRAVENOUS

## 2024-05-19 MED ORDER — OXYCODONE HCL 5 MG PO TABS: 5.0000 mg | ORAL_TABLET | Freq: Four times a day (QID) | ORAL | 0 refills | Status: AC | PRN

## 2024-05-19 MED ORDER — CLONIDINE HCL (ANALGESIA) 100 MCG/ML EP SOLN
EPIDURAL | Status: DC | PRN
  Administered 2024-05-19: 100 ug

## 2024-05-19 MED ORDER — CEFAZOLIN SODIUM-DEXTROSE 2-4 GM/100ML-% IV SOLN
2.0000 g | INTRAVENOUS | Status: AC
  Administered 2024-05-19: 2 g via INTRAVENOUS

## 2024-05-19 MED ORDER — 0.9 % SODIUM CHLORIDE (POUR BTL) OPTIME
TOPICAL | Status: DC | PRN
  Administered 2024-05-19: 1000 mL

## 2024-05-19 MED ORDER — ACETAMINOPHEN 500 MG PO TABS
ORAL_TABLET | ORAL | Status: AC
  Filled 2024-05-19: qty 2

## 2024-05-19 MED ORDER — FENTANYL CITRATE (PF) 100 MCG/2ML IJ SOLN
INTRAMUSCULAR | Status: AC
  Filled 2024-05-19: qty 2

## 2024-05-19 MED ORDER — MIDAZOLAM HCL 2 MG/2ML IJ SOLN
INTRAMUSCULAR | Status: AC
  Filled 2024-05-19: qty 2

## 2024-05-19 MED ORDER — BUPIVACAINE-EPINEPHRINE (PF) 0.5% -1:200000 IJ SOLN
INTRAMUSCULAR | Status: DC | PRN
  Administered 2024-05-19: 30 mL via PERINEURAL

## 2024-05-19 MED ORDER — OXYCODONE HCL 5 MG/5ML PO SOLN: 5.0000 mg | Freq: Once | ORAL | Status: DC | PRN

## 2024-05-19 MED ORDER — ACETAMINOPHEN 500 MG PO TABS
1000.0000 mg | ORAL_TABLET | Freq: Once | ORAL | Status: AC
  Administered 2024-05-19: 1000 mg via ORAL

## 2024-05-19 MED ORDER — LACTATED RINGERS IV SOLN: INTRAVENOUS | Status: DC

## 2024-05-19 MED ORDER — OXYCODONE HCL 5 MG PO TABS: 5.0000 mg | ORAL_TABLET | Freq: Once | ORAL | Status: DC | PRN

## 2024-05-19 MED ORDER — CEFAZOLIN SODIUM-DEXTROSE 2-4 GM/100ML-% IV SOLN
INTRAVENOUS | Status: AC
  Filled 2024-05-19: qty 100

## 2024-05-19 MED ORDER — PROPOFOL 500 MG/50ML IV EMUL
INTRAVENOUS | Status: AC
  Filled 2024-05-19: qty 50

## 2024-05-19 SURGICAL SUPPLY — 39 items
BLADE MINI RND TIP GREEN BEAV (BLADE) ×1 IMPLANT
BLADE SURG 15 STRL LF DISP TIS (BLADE) ×2 IMPLANT
BNDG COHESIVE 4X5 TAN STRL LF (GAUZE/BANDAGES/DRESSINGS) ×1 IMPLANT
BNDG COMPR ESMARK 4X3 LF (GAUZE/BANDAGES/DRESSINGS) ×1 IMPLANT
BNDG ELASTIC 4INX 5YD STR LF (GAUZE/BANDAGES/DRESSINGS) ×1 IMPLANT
BNDG GAUZE DERMACEA FLUFF 4 (GAUZE/BANDAGES/DRESSINGS) ×1 IMPLANT
CHLORAPREP W/TINT 26 (MISCELLANEOUS) ×1 IMPLANT
CORD BIPOLAR FORCEPS 12FT (ELECTRODE) ×1 IMPLANT
COVER BACK TABLE 60X90IN (DRAPES) ×1 IMPLANT
CUFF TOURN SGL QUICK 18X4 (TOURNIQUET CUFF) IMPLANT
DRAPE HAND 77X146 (DRAPES) ×1 IMPLANT
DRAPE SURG 17X23 STRL (DRAPES) ×1 IMPLANT
GAUZE 4X4 16PLY ~~LOC~~+RFID DBL (SPONGE) IMPLANT
GAUZE PAD ABD 8X10 STRL (GAUZE/BANDAGES/DRESSINGS) ×1 IMPLANT
GAUZE SPONGE 4X4 12PLY STRL (GAUZE/BANDAGES/DRESSINGS) ×1 IMPLANT
GAUZE STRETCH 2X75IN STRL (MISCELLANEOUS) ×1 IMPLANT
GAUZE XEROFORM 1X8 LF (GAUZE/BANDAGES/DRESSINGS) ×1 IMPLANT
GLOVE BIO SURGEON STRL SZ7.5 (GLOVE) ×1 IMPLANT
GLOVE BIOGEL PI IND STRL 7.5 (GLOVE) ×1 IMPLANT
GOWN STRL REUS W/ TWL LRG LVL3 (GOWN DISPOSABLE) ×2 IMPLANT
GOWN STRL SURGICAL XL XLNG (GOWN DISPOSABLE) ×2 IMPLANT
KWIRE DBL TROCAR .062X4 (WIRE) IMPLANT
NEEDLE HYPO 25X5/8 SAFETYGLIDE (NEEDLE) IMPLANT
PACK BASIN DAY SURGERY FS (CUSTOM PROCEDURE TRAY) ×1 IMPLANT
SHEET MEDIUM DRAPE 40X70 STRL (DRAPES) ×1 IMPLANT
SLING ARM FOAM STRAP LRG (SOFTGOODS) IMPLANT
SOLN 0.9% NACL POUR BTL 1000ML (IV SOLUTION) IMPLANT
SPIKE FLUID TRANSFER (MISCELLANEOUS) IMPLANT
SPLINT FIBERGLASS 4X30 (CAST SUPPLIES) IMPLANT
STOCKINETTE IMPERVIOUS 9X36 MD (GAUZE/BANDAGES/DRESSINGS) ×1 IMPLANT
SUCTION TUBE FRAZIER 10FR DISP (SUCTIONS) IMPLANT
SUT ETHILON 4 0 PS 2 18 (SUTURE) ×1 IMPLANT
SUT VIC AB 0 SH 27 (SUTURE) IMPLANT
SUT VIC AB 3-0 SH 27X BRD (SUTURE) IMPLANT
SYR BULB EAR ULCER 3OZ GRN STR (SYRINGE) ×2 IMPLANT
SYR CONTROL 10ML LL (SYRINGE) ×1 IMPLANT
TOWEL GREEN STERILE FF (TOWEL DISPOSABLE) ×2 IMPLANT
TUBE CONNECTING 20X1/4 (TUBING) IMPLANT
UNDERPAD 30X36 HEAVY ABSORB (UNDERPADS AND DIAPERS) ×1 IMPLANT

## 2024-05-19 NOTE — Discharge Instructions (Addendum)
 " Post Anesthesia Home Care Instructions  Activity: Get plenty of rest for the remainder of the day. A responsible individual must stay with you for 24 hours following the procedure.  For the next 24 hours, DO NOT: -Drive a car -Advertising copywriter -Drink alcoholic beverages -Take any medication unless instructed by your physician -Make any legal decisions or sign important papers.  Meals: Start with liquid foods such as gelatin or soup. Progress to regular foods as tolerated. Avoid greasy, spicy, heavy foods. If nausea and/or vomiting occur, drink only clear liquids until the nausea and/or vomiting subsides. Call your physician if vomiting continues.  Special Instructions/Symptoms: Your throat may feel dry or sore from the anesthesia or the breathing tube placed in your throat during surgery. If this causes discomfort, gargle with warm salt water. The discomfort should disappear within 24 hours.  If you had a scopolamine patch placed behind your ear for the management of post- operative nausea and/or vomiting:  1. The medication in the patch is effective for 72 hours, after which it should be removed.  Wrap patch in a tissue and discard in the trash. Wash hands thoroughly with soap and water. 2. You may remove the patch earlier than 72 hours if you experience unpleasant side effects which may include dry mouth, dizziness or visual disturbances. 3. Avoid touching the patch. Wash your hands with soap and water after contact with the patch.     NO TYLENOL  UNTIL AFTER 1:00PM TODAY!  Regional Anesthesia Blocks  1. You may not be able to move or feel the blocked extremity after a regional anesthetic block. This may last may last from 3-48 hours after placement, but it will go away. The length of time depends on the medication injected and your individual response to the medication. As the nerves start to wake up, you may experience tingling as the movement and feeling returns to your  extremity. If the numbness and inability to move your extremity has not gone away after 48 hours, please call your surgeon.   2. The extremity that is blocked will need to be protected until the numbness is gone and the strength has returned. Because you cannot feel it, you will need to take extra care to avoid injury. Because it may be weak, you may have difficulty moving it or using it. You may not know what position it is in without looking at it while the block is in effect.  3. For blocks in the legs and feet, returning to weight bearing and walking needs to be done carefully. You will need to wait until the numbness is entirely gone and the strength has returned. You should be able to move your leg and foot normally before you try and bear weight or walk. You will need someone to be with you when you first try to ensure you do not fall and possibly risk injury.  4. Bruising and tenderness at the needle site are common side effects and will resolve in a few days.  5. Persistent numbness or new problems with movement should be communicated to the surgeon or the [facility] [id number] [facility] [phone]).       Hand Surgery Postop Instructions   Dressings: Maintain postoperative dressing until orthopedic follow-up.  Keep operative site clean and dry until orthopedic follow-up.  Wound Care: Keep your hand elevated above the level of your heart.  Do not allow it to dangle by your side. Moving your fingers is advised  to stimulate circulation but will depend on the site of your surgery.  If you have a splint applied, your doctor will advise you regarding movement.  Activity: Do not drive or operate machinery until clearance given from physician. No heavy lifting with operative extremity.  Diet:  Drink liquids today or eat a light diet.  You may resume a regular diet tomorrow.    General expectations: Take prescribed medication if given, transition  to over-the-counter medication as quickly as possible. Fingers may become slightly swollen.  Call your doctor if any of the following occur: Severe pain not relieved by pain medication. Elevated temperature. Dressing soaked with blood. Inability to move fingers. White or bluish color to fingers.   Per [facility] clinic policy, our goal is ensure optimal postoperative pain control with a multimodal pain management strategy. For all [facility] patients, our goal is to wean post-operative narcotic medications by 6 weeks post-operatively. If this is not possible due to utilization of pain medication prior to surgery, your [facility] doctor will support your acute post-operative pain control for the first 6 weeks postoperatively, with a plan to transition you back to your primary pain team following that. Maralee will work to ensure a therapist, occupational.  Anshul Afton Alderton, M.D. Hand Surgery [facility]  "

## 2024-05-19 NOTE — H&P (Signed)
 @LOGODEPT @  Russell Cross - 64 y.o. male MRN [id number]  Date of birth: 1960-10-01   HAND SURGERY H&P UPDATE   HPI: Patient is a 64 y.o. male who presents for right wrist proximal row carpectomy with possible allograft spacer and right open carpal tunnel release .  Patient denies any changes to their medical history or new systemic symptoms today.    Past Medical History:  Diagnosis Date   Anxiety    Depression    GERD (gastroesophageal reflux disease)    Past Surgical History:  Procedure Laterality Date   SHOULDER OPEN ROTATOR CUFF REPAIR     Social History   Socioeconomic History   Marital status: Married    Spouse name: Not on file   Number of children: Not on file   Years of education: Not on file   Highest education level: Not on file  Occupational History   Not on file  Tobacco Use   Smoking status: Never   Smokeless tobacco: Not on file  Substance and Sexual Activity   Alcohol use: Yes    Alcohol/week: 14.0 standard drinks of alcohol    Types: 14 Cans of beer per week   Drug use: No   Sexual activity: Not on file  Other Topics Concern   Not on file  Social History Narrative   Not on file   Social Drivers of Health   Tobacco Use: Unknown (05/08/2024)   Patient History    Smoking Tobacco Use: Never    Smokeless Tobacco Use: Unknown    Passive Exposure: Not on file  Financial Resource Strain: Not on file  Food Insecurity: Not on file  Transportation Needs: Not on file  Physical Activity: Not on file  Stress: Not on file  Social Connections: Not on file  Depression (EYV7-0): Not on file  Alcohol Screen: Not on file  Housing: Not on file  Utilities: Not on file  Health Literacy: Not on file   Family History  Problem Relation Age of Onset   Heart attack Father    - negative except otherwise stated in the family history section Allergies[1] Prior to Admission medications  Medication Sig Start Date End Date Taking? Authorizing Provider   buPROPion (WELLBUTRIN XL) 150 MG 24 hr tablet Take 150 mg by mouth 2 (two) times daily.    Yes [provider]  citalopram (CELEXA) 10 MG tablet Take 10 mg by mouth daily.   Yes [provider]  pantoprazole  (PROTONIX ) 20 MG tablet Take 1 tablet (20 mg total) by mouth daily. 12/27/10 07/03/12  Flint Sonny POUR, PA-C   No results found. - Positive ROS: All other systems have been reviewed and were otherwise negative with the exception of those mentioned in the HPI and as above.  Physical Exam: General: No acute distress, resting comfortably Cardiovascular: BUE warm and well perfused, normal rate Respiratory: Normal WOB on RA Skin: Warm and dry Neurologic: Sensation intact distally Psychiatric: Patient is at baseline mood and affect  General: Patient is well appearing and in no distress.    Skin and Muscle: No significant skin changes are apparent to hands or wrists.  Muscle bulk and contour normal, no signs of atrophy.      Range of Motion and Palpation Tests: There is significant tenderness over the scapholunate interval of the right wrist, particularly at the lunate.  No evidence of instability with Mathew shift testing, negative clunk, moderate pain at the right wrist.   Right wrist range of motion limited  compared to the left wrist.  Right wrist flexion and extension 45/35, left wrist 65/55.   Neurologic, Vascular, Motor: Sensation is diminished to light touch in median nerve distribution of the right hand, slightly diminished to light touch in ulnar nerve distribution   2-point discrimination in the right median nerve distribution is between 7 and 8 mm.   Positive Tinel's over the right carpal tunnel region, positive Phalen's right Fingers pink and well perfused.  Capillary refill is brisk.     Small finger FDP 5/5, negative Froment's right side   Assessment/Plan: OR today for right wrist proximal row carpectomy with possible allograft spacer and right open  carpal tunnel release. We again reviewed the risks of surgery which include bleeding, infection, damage to neurovascular structures, persistent symptoms, need for additional surgery.  Informed consent was signed.  All questions were answered.   [facility], Hand Surgery      [1] No Known Allergies

## 2024-05-19 NOTE — Op Note (Signed)
 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   ASSISTANT: Joesph Dinsmore, OPA   ANESTHESIA:  Regional with sedation   INTRAVENOUS FLUIDS:  Per anesthesia flow sheet.   ESTIMATED BLOOD LOSS:  Minimal.   COMPLICATIONS:  None.   SPECIMENS:  none   TOURNIQUET TIME:    Total Tourniquet Time Documented: Upper Arm (Right) - 49 minutes Total: Upper Arm (Right) - 49 minutes    DISPOSITION:  Stable to PACU.   INDICATIONS: 64 year old male with history of prior right wrist Kienbck's disease with associated lunate fragmentation and collapse that was progressive over the past year.  Patient had failed extensive nonoperative care.  Patient was also found to have clinical and electrodiagnostic evidence of right-sided carpal tunnel syndrome refractory to conservative care.    Given the advanced stage of the Kienbock's disease, patient was indicated for right wrist proximal row carpectomy.  There was some slight chondral wear seen on the preoperative MRI of the capitate surface, possible allograft spacer was indicated.  Decision was also made to proceed with open carpal tunnel release in conjunction with the above procedure.  Risks and benefits of surgery were discussed including the risks of infection, bleeding, scarring, stiffness, nerve injury, vascular injury, tendon injury, need for subsequent operation, persistent symptoms, wrist  instability, further arthritic progression, recurrence.  He voiced understanding of these risks and elected to proceed.  OPERATIVE COURSE: Patient was seen and identified in the preoperative area and marked appropriately.  Surgical consent had been signed. Preoperative IV antibiotic prophylaxis was given. He was transferred to the operating room and placed in supine position with the Right Right upper extremity on an arm board.  Sedation was induced by the anesthesiologist. A regional block had been performed by anesthesia in preoperative holding.    Right Right upper extremity was prepped and draped in normal sterile orthopedic fashion.  A surgical pause was performed between the surgeons, anesthesia, and operating room staff and all were in agreement as to the patient, procedure, and site of procedure.  Tourniquet was placed and padded appropriately with the right upper arm.  The arm was exsanguinated the tourniquet was inflated to 250 mmHg.  A longitudinal incision was designed over the dorsal aspect of the left wrist just ulnar to Lister's tubercle.  Blunt dissection was performed down, exposure was performed between the second and fourth extensor compartments. Retinaculum was incised to allow for transposition of the EPL tendon. Within the fourth extensor compartment, we identified the posterior interosseous nerve, which was cauterized, 1 cm was transected as well for pain control purposes.  Once PIN neurectomy was completed, attention was returned to the wrist.  Wrist capsule was incised in longitudinal fashion, T-shaped incision was performed at the level of the distal radius to create appropriate capsular flaps.   Proximal row of the carpus was identified and confirmed with fluoroscopy.  Utilizing K wire as a joystick, and with use of the McGlamery instrument, lunate was first excised followed by excision of both the triquetrum and  the scaphoid, care was taken to avoid injury to the radial scaphoid  capitate ligament.  Appropriate proximal row carpectomy was then confirmed with fluoroscopy.  Copious irrigation was performed of the wrist joint.  Capitate surface was then inspected, there was minimal chondral wear notable at the capitate surface radially, primary weightbearing surface had appropriate cartilage layer, decision was made to forego allograft spacer.   After copious irrigation, capsular closure was then performed utilizing 0 Vicryl in figure-of-eight fashion.  Tourniquet was deflated and bipolar electrocautery utilized for hemostasis.  Tourniquet time was 49 minutes.  This followed by retinacular closure utilizing 3-0 Vicryl as well, leaving EPL transposed.  Layered closure was then performed utilizing 3-0 Vicryl for the subcutaneous layer and 4-0 nylon for the skin surface over the wrist.   Clamshell splint was applied to the wrist utilizing plaster, fingertips were pink with brisk capillary refill after deflation of tourniquet.  Sling was applied.  The operative drapes were broken down.  The patient was awoken from anesthesia safely and taken to PACU in stable condition.    Post-operative plan: The patient will recover in the post-anesthesia care unit and then be discharged home.  The patient will be non weight bearing on the right upper extremity in a clamshell orthosis for the wrist.   I will see the patient back in the office in 2 weeks for postoperative followup.     Abhi Moccia, MD Electronically signed, 05/19/2024

## 2024-05-19 NOTE — Anesthesia Postprocedure Evaluation (Signed)
"   Anesthesia Post Note  Patient: Russell Cross  Procedure(s) Performed: CARPECTOMY (Right: Wrist) CARPAL TUNNEL RELEASE (Right: Wrist)     Patient location during evaluation: PACU Anesthesia Type: Regional and MAC Level of consciousness: awake and alert Pain management: pain level controlled Vital Signs Assessment: post-procedure vital signs reviewed and stable Respiratory status: spontaneous breathing, nonlabored ventilation and respiratory function stable Cardiovascular status: blood pressure returned to baseline Postop Assessment: no apparent nausea or vomiting Anesthetic complications: no   No notable events documented.  Last Vitals:  Vitals:   05/19/24 1230 05/19/24 1248  BP: 112/77 (!) 131/91  Pulse: (!) 51   Resp: 11 18  Temp:  36.6 C  SpO2: 96% 99%    Last Pain:  Vitals:   05/19/24 1248  TempSrc: Temporal  PainSc: 0-No pain                 Vertell Row      "

## 2024-05-19 NOTE — Progress Notes (Signed)
Assisted Dr. Howze with right, supraclavicular, ultrasound guided block. Side rails up, monitors on throughout procedure. See vital signs in flow sheet. Tolerated Procedure well. 

## 2024-05-19 NOTE — Transfer of Care (Signed)
 Immediate Anesthesia Transfer of Care Note  Patient: Russell Cross  Procedure(s) Performed: Procedures (LRB): CARPECTOMY (Right) CARPAL TUNNEL RELEASE (Right)  Patient Location: PACU  Anesthesia Type: MAC  Level of Consciousness: awake, alert , oriented and patient cooperative  Airway & Oxygen Therapy: Patient Spontanous Breathing Room Air  Post-op Assessment: Report given to PACU RN and Post -op Vital signs reviewed and stable  Post vital signs: Reviewed and stable  Complications: No apparent anesthesia complications  Last Vitals:  Vitals Value Taken Time  BP 115/80 05/19/24 12:06  Temp    Pulse 53 05/19/24 12:07  Resp 15 05/19/24 12:07  SpO2 94 % 05/19/24 12:07  Vitals shown include unfiled device data.  Last Pain:  Vitals:   05/19/24 0830  TempSrc: Temporal  PainSc: 0-No pain         Complications: No notable events documented.

## 2024-05-19 NOTE — Anesthesia Procedure Notes (Signed)
 Anesthesia Regional Block: Supraclavicular block   Pre-Anesthetic Checklist: , timeout performed,  Correct Patient, Correct Site, Correct Laterality,  Correct Procedure, Correct Position, site marked,  Risks and benefits discussed,  Pre-op evaluation,  At surgeon's request and post-op pain management  Laterality: Right  Prep: Maximum Sterile Barrier Precautions used, chloraprep       Needles:  Injection technique: Single-shot  Needle Type: Echogenic Stimulator Needle     Needle Length: 9cm  Needle Gauge: 22     Additional Needles:   Procedures:,,,, ultrasound used (permanent image in chart),,    Narrative:  Start time: 05/19/2024 8:53 AM End time: 05/19/2024 8:56 AM Injection made incrementally with aspirations every 5 mL.  Performed by: Personally  Anesthesiologist: Paul Lamarr BRAVO, MD  Additional Notes: Risks, benefits, and alternative discussed. Patient gave consent for procedure. Patient prepped and draped in sterile fashion. Sedation administered, patient remains easily responsive to voice. Relevant anatomy identified with ultrasound guidance. Local anesthetic given in 5cc increments with no signs or symptoms of intravascular injection. No pain or paraesthesias with injection. Patient monitored throughout procedure with no signs of LAST or immediate complications. Tolerated well. Ultrasound image placed in chart.  LANEY Paul, MD

## 2024-05-20 ENCOUNTER — Encounter (HOSPITAL_BASED_OUTPATIENT_CLINIC_OR_DEPARTMENT_OTHER): Payer: Self-pay | Admitting: Orthopedic Surgery

## 2024-05-30 ENCOUNTER — Other Ambulatory Visit: Payer: Self-pay | Admitting: Orthopedic Surgery

## 2024-05-30 DIAGNOSIS — M25531 Pain in right wrist: Secondary | ICD-10-CM

## 2024-06-01 ENCOUNTER — Ambulatory Visit: Payer: Worker's Compensation | Admitting: Orthopedic Surgery

## 2024-06-01 ENCOUNTER — Ambulatory Visit (INDEPENDENT_AMBULATORY_CARE_PROVIDER_SITE_OTHER): Payer: Worker's Compensation

## 2024-06-01 ENCOUNTER — Ambulatory Visit (INDEPENDENT_AMBULATORY_CARE_PROVIDER_SITE_OTHER): Payer: Worker's Compensation | Admitting: Rehabilitative and Restorative Service Providers"

## 2024-06-01 ENCOUNTER — Encounter: Payer: Self-pay | Admitting: Rehabilitative and Restorative Service Providers"

## 2024-06-01 DIAGNOSIS — M25631 Stiffness of right wrist, not elsewhere classified: Secondary | ICD-10-CM

## 2024-06-01 DIAGNOSIS — R600 Localized edema: Secondary | ICD-10-CM

## 2024-06-01 DIAGNOSIS — M6281 Muscle weakness (generalized): Secondary | ICD-10-CM

## 2024-06-01 DIAGNOSIS — M25531 Pain in right wrist: Secondary | ICD-10-CM

## 2024-06-01 DIAGNOSIS — R278 Other lack of coordination: Secondary | ICD-10-CM

## 2024-06-01 DIAGNOSIS — M25641 Stiffness of right hand, not elsewhere classified: Secondary | ICD-10-CM

## 2024-06-01 DIAGNOSIS — M79641 Pain in right hand: Secondary | ICD-10-CM

## 2024-06-01 NOTE — Progress Notes (Signed)
" ° °  Russell Cross - 64 y.o. male MRN [id number]  Date of birth: [birth year]  Office Visit Note: Visit Date: 06/01/2024 PCP: Silver Lamar LABOR, MD Referred by: Silver Lamar LABOR, MD  Subjective:  HPI: Russell Cross is a 64 y.o. male who presents today for follow up 2 weeks status post right wrist open carpal tunnel release. Right wrist proximal row carpectomy.  Doing very well overall, pain is controlled.  Has been doing digital range of motion, has been compliant with splinting.  Pertinent ROS were reviewed with the patient and found to be negative unless otherwise specified above in HPI.   Assessment & Plan: Visit Diagnoses:  1. Pain in right wrist     Plan: He is doing very well postoperatively.  He will be seen by occupational therapy today for fabrication of an orthosis for the wrist.  Splint is to be maintained at all time for 2 weeks except for hygiene, okay for digital motion.  He will return to me in 2 weeks for repeat check, at that juncture we can begin gentle wrist range of motion with therapy.  Follow-up: No follow-ups on file.   Meds & Orders: No orders of the defined types were placed in this encounter.   Orders Placed This Encounter  Procedures   XR Wrist Complete Right     Procedures: No procedures performed       Objective:   Vital Signs: There were no vitals taken for this visit.  Ortho Exam Right wrist, volar and dorsal incisions well-healing, sutures removed today, minimal swelling, pain is well-controlled, digital range of motion is preserved, sensation intact median/radial/ulnar distributions, AIN/PIN/interosseous intact  Imaging: XR Wrist Complete Right Result Date: 06/01/2024 X-rays of the right wrist demonstrate stable appearance of the radiocarpal joint status post proximal row carpectomy, capitate remains well-seated within the lunate fossa    Yarethzi Branan Estela) Karilyn Wind, M.D. [facility], Hand Surgery  "

## 2024-06-01 NOTE — Therapy (Signed)
 " OUTPATIENT OCCUPATIONAL THERAPY ORTHO EVALUATION, treatment and discharge  Patient Name: Russell Cross MRN: [id number] DOB:[birth year], 64 y.o., male Today's Date: 06/01/2024  PCP: Silver HAMS MD REFERRING PROVIDER: Dr Arlinda   END OF SESSION:  OT End of Session - 06/01/24 0853     Visit Number 1    Number of Visits 1    Authorization Type WC    OT Start Time 0853    OT Stop Time 0930    OT Time Calculation (min) 37 min    Equipment Utilized During Treatment orthotic materials    Activity Tolerance Patient tolerated treatment well;No increased pain;Patient limited by fatigue;Patient limited by pain    Behavior During Therapy [facility] for tasks assessed/performed          Past Medical History:  Diagnosis Date   Anxiety    Depression    GERD (gastroesophageal reflux disease)    Past Surgical History:  Procedure Laterality Date   CARPAL TUNNEL RELEASE Right 05/19/2024   Procedure: CARPAL TUNNEL RELEASE;  Surgeon: Arlinda Buster, MD;  Location: [facility];  Service: Orthopedics;  Laterality: Right;   CARPECTOMY Right 05/19/2024   Procedure: CARPECTOMY;  Surgeon: Arlinda Buster, MD;  Location: [facility];  Service: Orthopedics;  Laterality: Right;  RIGHT WRIST PROXIMAL ROW CARPECTOMY  OPEN CARPAL TUNNEL RELEASE / POSTERIOR INTEROSSEOUS NERVE NEUROECTOMY   SHOULDER OPEN ROTATOR CUFF REPAIR     Patient Active Problem List   Diagnosis Date Noted   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  He states he had a fall at work, injuring his wrist.  This became degenerative disease over time eventually needing proximal row carpectomy.  He also had paresthesia in the median nerve distribution, but this is also improved now after carpal tunnel release.  The doctor sent him over for an orthosis to be made to support his surgical sites, but he will be receiving future therapy at a facility closer to his home.    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 Yes: NPRS scale: 1-2/10 at rest today Pain location: Right wrist and hand surgical areas Pain description: Aching and sore Aggravating factors: Aggressive motion or attempted weightbearing Relieving factors: Rest  PLOF: Independent  PATIENT GOALS: To obtain an orthosis and basic safety information and then discharge to therapy that is closer to his home  NEXT MD VISIT: Approximately 4 weeks   OBJECTIVE: (All objective assessments below are from initial  evaluation on: 06/01/2024 unless otherwise specified.)    HAND DOMINANCE: Right   ADLs: Overall ADLs: States decreased ability to grab, hold household objects, pain and difficulty to open containers, perform FMS tasks (manipulate fasteners on clothing), difficulties with basic ADLs   UPPER EXTREMITY ROM       Eval: Shoulder, elbow range of motion on the right side  is within functional limits and was encouraged to perform often.  Forearm is moving without any apparent pain with orthosis on but somewhat limited.  Wrist is not tested per doctors orders, thumb and fingers moving fairly well for 2 weeks postsurgery.   Hand AROM Right eval  Full Fist Ability (or Gap to Distal Palmar Crease) Can almost make a composite fist, limited by swelling  Thumb Opposition  (Kapandji Scale)  5/10 today  (Blank rows = not tested)   UPPER EXTREMITY MMT:    Eval:  NT at eval due to recent and still healing injuries.  Will require additional rehab at his local rehab facility  HAND FUNCTION: Eval: Observed weakness in affected Rt hand/arm, grossly 3-/5 MMT, but specific gripping and resistance training contraindicated today.  Will require additional therapy at his local rehab facility  SENSATION: Eval:  Light touch intact today, though diminished around sx area.  He states sensation in fingertips is now better after carpal tunnel release.  EDEMA:   Eval: Moderately swollen in right hand and wrist today compared to the left  COGNITION: Eval: Overall cognitive status: WFL for evaluation today   OBSERVATIONS:   Eval: Surgical site is clean and no overt signs of infection, no drainage, signs of dehiscence, etc.  Tenderness and swelling is within normal limits for post-op timeframe.     TODAY'S TREATMENT:  Post-evaluation treatment:   Custom orthotic fabrication was indicated due to pt's recent proximal row carpectomy and carpal tunnel release for the right arm and need for safe, functional positioning. OT fabricated custom clamshell wrist immobilization orthosis for pt today to protect the wrist and carpal tunnel area, immobilizing the wrist. It fit well with no areas of pressure, pt states a comfortable fit. Pt was educated on the wearing schedule (on at all times except for hygiene, wound care, gentle scar light touch and mobilization), to avoid exposing it to sources  of heat, to wipe clean as needed (do not wash, use harsh detergents), to call or come in ASAP if it is causing any irritation or is not achieving desired function. It will be checked/adjusted in upcoming sessions, as needed. Pt states understanding all directions.    The patient was given safety information for managing post-op wound, including not to soak wound, to keep clean and dry, to start with light scar mobilizations if the wound is looking well-healed.  He should monitor for signs of infection.  He was also supplied with compressive gauze to help with swelling as needed.  He should contact the surgeon with any concerns immediately.   The patient should avoid any wrist motion for the next 2 weeks per MD orders.  He was highly encouraged to do shoulder, elbow, gentle forearm motion as well as constant thumb and finger motion as tolerated.  Light activities less than 1 or 2 pounds are encouraged with the orthosis on.  Any weightbearing or strong gripping pushing or pulling will likely be delayed for several months.    PATIENT EDUCATION: Education details: See tx section above for details  Person educated: Patient Education method: Verbal Instruction, Teach back, Handouts  Education comprehension: States and demonstrates understanding   HOME EXERCISE PROGRAM: See tx section above for details    GOALS: Goals reviewed with patient? Yes   SHORT TERM GOALS: (STG required if POC>30 days) Target Date: 06/01/24  1.  Pt will demo/state understanding of initial HEP and therapist recommendations to improve pain levels, improve motions and ability and eventually return to normal activities.   Goal status: MET  2. Pt will obtain protective, custom orthotic to prevent injury to surgical site and prevent soft-tissue contractures.  Goal status: MET     ASSESSMENT:  CLINICAL IMPRESSION: Patient is a 64 y.o. male who was seen today for occupational therapy evaluation for swelling, pain,  weakness and decreased functional ability following Rt CTR, PRC procedure. The patient is appropriate for OT rehab services and benefited from treatment today, however he lives far away and will follow up with OT closer to his home. He required a supportive orthosis today for safety.   The patient should follow up with the surgeon with any concerns.    PERFORMANCE DEFICITS: in functional skills including ADLs, IADLs, coordination, dexterity, sensation, edema, ROM, strength, pain, fascial restrictions, flexibility, Fine motor control, body mechanics, endurance, decreased knowledge of precautions, wound, and UE functional use, cognitive skills including problem solving and safety awareness, and psychosocial skills including coping strategies, environmental adaptation, and habits.   IMPAIRMENTS: are limiting patient from ADLs, IADLs, rest and sleep, leisure, and social participation.   COMORBIDITIES: may have co-morbidities  that affects occupational performance. Patient will benefit from skilled OT to address above impairments and improve overall function.  MODIFICATION OR ASSISTANCE TO COMPLETE EVALUATION: No modification of tasks or assist necessary to complete an evaluation.  OT OCCUPATIONAL PROFILE AND HISTORY: Problem focused assessment: Including review of records relating to presenting problem.  CLINICAL DECISION MAKING: LOW - limited treatment options, no task modification necessary  REHAB POTENTIAL: Excellent  EVALUATION COMPLEXITY: Low      PLAN:  OT FREQUENCY: one time visit  OT DURATION: 1 sessions  PLANNED INTERVENTIONS: self care/ADL training, therapeutic exercise, therapeutic activity, neuromuscular re-education, manual therapy, scar mobilization, passive range of motion, splinting, ultrasound, fluidotherapy, compression bandaging, moist heat, cryotherapy, contrast bath, patient/family education, energy conservation, coping strategies training, and  Re-evaluation  RECOMMENDED OTHER SERVICES: none now   CONSULTED AND AGREED WITH PLAN OF CARE: Patient  PLAN FOR NEXT SESSION:   N/A - he will f/u with therapy at a closer facility to his home.    Melvenia Ada, OTR/L, CHT 06/01/2024, 9:44 AM   "

## 2024-06-06 ENCOUNTER — Telehealth: Payer: Self-pay | Admitting: Orthopedic Surgery

## 2024-06-06 NOTE — Telephone Encounter (Signed)
 Pt called saying that he was told he could go back to work, but when Dr. Erwin saw him he was told he couldn't go back to work and wants clarification. Call back number is [phone]

## 2024-06-06 NOTE — Telephone Encounter (Signed)
Called and explained to pt

## 2024-06-15 ENCOUNTER — Ambulatory Visit: Payer: Worker's Compensation | Admitting: Orthopedic Surgery

## 2024-06-15 DIAGNOSIS — Z9889 Other specified postprocedural states: Secondary | ICD-10-CM

## 2024-06-15 DIAGNOSIS — M25531 Pain in right wrist: Secondary | ICD-10-CM

## 2024-06-15 DIAGNOSIS — G5601 Carpal tunnel syndrome, right upper limb: Secondary | ICD-10-CM

## 2024-06-15 NOTE — Progress Notes (Signed)
" ° °  SEVILLE DOWNS - 64 y.o. male MRN [id number]  Date of birth: 09/01/1960  Office Visit Note: Visit Date: 06/15/2024 PCP: Silver Lamar LABOR, MD Referred by: Silver Lamar LABOR, MD  Subjective:  HPI: Russell Cross is a 64 y.o. male who presents today for follow up 4 weeks status post right wrist open carpal tunnel release. Right wrist proximal row carpectomy . Doing well overall, minimal pain. He states he has not started formalized physical therapy yet due to workers comp denying the location he preferred. He is to begin therapy next week. He has seen our therapist and a custom splint has been made which he is wearing today. His main complaints today is stiffness and his wrist being sore.  Pertinent ROS were reviewed with the patient and found to be negative unless otherwise specified above in HPI.   Assessment & Plan: Visit Diagnoses:  1. Pain in right wrist   2. Carpal tunnel syndrome, right upper limb   3. S/P carpal tunnel release     Plan: Hurshel is to continue in the splint that was made for him. He will begin formalized OT next week with [facility] in [place]. Work note given today to keep him completely out for 2 more weeks, at which point he may then return light duty with restrictions. We will see him back in the office in 4 weeks for clinical recheck and see progress from OT.  Follow-up: No follow-ups on file.   Meds & Orders: No orders of the defined types were placed in this encounter.  No orders of the defined types were placed in this encounter.    Procedures: No procedures performed       Objective:   Vital Signs: There were no vitals taken for this visit.  Ortho Exam Right wrist: Well healing incisions Minimal swelling  Imaging: No results found.   Joesph Dinsmore, OPA-C "

## 2024-07-13 ENCOUNTER — Encounter: Admitting: Orthopedic Surgery
